# Patient Record
Sex: Female | Born: 1995 | Hispanic: Yes | Marital: Single | Smoking: Current some day smoker
Health system: Southern US, Community
[De-identification: ages and names within clinical notes are randomized; demographics above are authoritative.]

## PROBLEM LIST (undated history)

## (undated) DIAGNOSIS — N390 Urinary tract infection, site not specified: Secondary | ICD-10-CM

## (undated) DIAGNOSIS — J45909 Unspecified asthma, uncomplicated: Secondary | ICD-10-CM

## (undated) DIAGNOSIS — F419 Anxiety disorder, unspecified: Secondary | ICD-10-CM

## (undated) DIAGNOSIS — R32 Unspecified urinary incontinence: Secondary | ICD-10-CM

## (undated) DIAGNOSIS — K2960 Other gastritis without bleeding: Secondary | ICD-10-CM

## (undated) HISTORY — DX: Urinary tract infection, site not specified: N39.0

## (undated) HISTORY — DX: Unspecified urinary incontinence: R32

## (undated) HISTORY — DX: Anxiety disorder, unspecified: F41.9

## (undated) HISTORY — PX: COSMETIC SURGERY: SHX468

---
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
## 2016-09-02 ENCOUNTER — Ambulatory Visit (HOSPITAL_COMMUNITY)
Admission: EM | Admit: 2016-09-02 | Discharge: 2016-09-02 | Disposition: A | Discharge status: Home / Self Care | Payer: Medicaid Other | Attending: Emergency Medicine | Admitting: Emergency Medicine

## 2016-09-02 ENCOUNTER — Encounter (HOSPITAL_COMMUNITY): Payer: Self-pay | Admitting: Emergency Medicine

## 2016-09-02 DIAGNOSIS — J039 Acute tonsillitis, unspecified: Secondary | ICD-10-CM | POA: Insufficient documentation

## 2016-09-02 DIAGNOSIS — J029 Acute pharyngitis, unspecified: Secondary | ICD-10-CM | POA: Diagnosis present

## 2016-09-02 LAB — POCT RAPID STREP A: STREPTOCOCCUS, GROUP A SCREEN (DIRECT): NEGATIVE

## 2016-09-02 LAB — POCT INFECTIOUS MONO SCREEN: MONO SCREEN: NEGATIVE

## 2016-09-02 MED ORDER — PREDNISONE 20 MG PO TABS: 40.0000 mg | ORAL_TABLET | Freq: Every day | ORAL | 0 refills | Status: AC

## 2016-09-02 MED ORDER — AMOXICILLIN-POT CLAVULANATE 875-125 MG PO TABS: 1.0000 | ORAL_TABLET | Freq: Two times a day (BID) | ORAL | 0 refills | Status: DC

## 2016-09-02 NOTE — Discharge Instructions (Signed)
Recommend start Augmentin twice a day as directed. Take Prednisone daily for 5 days. Recommend follow-up with your primary care provider in 3 days if not improving- may need to be referred to an ENT.

## 2016-09-02 NOTE — ED Triage Notes (Signed)
Pt here for ST onset 1 week ++ associated w/dysphagia, swelling of tonsils and uvula, bilateral ear pain   Denies fevers, chills  Seen by PCP and strep culture came back negative.   A&O x4... NAD

## 2016-09-02 NOTE — ED Provider Notes (Signed)
CSN: [id number]     Arrival date & time 09/02/16  1233 History   First MD Initiated Contact with Patient 09/02/16 1352     Chief Complaint  Patient presents with  . Sore Throat   (Consider location/radiation/quality/duration/timing/severity/associated sxs/prior Treatment) 20 year old female presents with sore throat and tonsillar swelling for over 1 week. Has noticed occasional white patches on tonsils. Denies any nasal congestion, cough or GI symptoms. Had fever earlier in the week but is resolving. Was seen at her PCP- had negative strep culture and mono test. Has been taking Ibuprofen with minimal relief. Takes no other daily medication.    The history is provided by the patient.    History reviewed. No pertinent past medical history. History reviewed. No pertinent surgical history. History reviewed. No pertinent family history. Social History  Substance Use Topics  . Smoking status: Never Smoker  . Smokeless tobacco: Never Used  . Alcohol use No   OB History    No data available     Review of Systems  Constitutional: Positive for fatigue and fever. Negative for appetite change and chills.  HENT: Positive for sore throat and trouble swallowing. Negative for congestion, ear pain, rhinorrhea, sinus pressure and sneezing.   Eyes: Negative for discharge.  Respiratory: Negative for cough, shortness of breath and wheezing.   Cardiovascular: Negative for chest pain.  Gastrointestinal: Negative for abdominal pain, diarrhea, nausea and vomiting.  Musculoskeletal: Negative for neck pain and neck stiffness.  Skin: Negative for rash.  Neurological: Positive for headaches. Negative for dizziness, weakness and light-headedness.  Hematological: Positive for adenopathy.    Allergies  Review of patient's allergies indicates no known allergies.  Home Medications   Prior to Admission medications   Medication Sig Start Date End Date Taking? Authorizing Provider  amoxicillin-clavulanate  (AUGMENTIN) 875-125 MG tablet Take 1 tablet by mouth every 12 (twelve) hours. 09/02/16   Sudie GrumblingAnn Berry Lavada Langsam, NP  predniSONE (DELTASONE) 20 MG tablet Take 2 tablets (40 mg total) by mouth daily. 09/02/16 09/07/16  Sudie GrumblingAnn Berry Luretha Eberly, NP   Meds Ordered and Administered this Visit  Medications - No data to display  BP 98/58 (BP Location: Left Arm)   Pulse 82   Temp 98.4 F (36.9 C) (Oral)   Resp 16   LMP 08/11/2016   SpO2 100%  No data found.   Physical Exam  Constitutional: She is oriented to person, place, and time. She appears well-developed and well-nourished. She appears ill. No distress.  HENT:  Head: Normocephalic and atraumatic.  Right Ear: Hearing, tympanic membrane, external ear and ear canal normal.  Left Ear: Hearing, tympanic membrane, external ear and ear canal normal.  Nose: Nose normal. Right sinus exhibits no maxillary sinus tenderness and no frontal sinus tenderness. Left sinus exhibits no maxillary sinus tenderness and no frontal sinus tenderness.  Mouth/Throat: Uvula is midline and mucous membranes are normal. No uvula swelling. Oropharyngeal exudate (white on left tonsil), posterior oropharyngeal edema and posterior oropharyngeal erythema present. No tonsillar abscesses. Tonsils are 3+ on the right. Tonsils are 3+ on the left.  Neck: Normal range of motion. Neck supple.  Cardiovascular: Normal rate, regular rhythm and normal heart sounds.   Pulmonary/Chest: Effort normal and breath sounds normal. She has no wheezes. She has no rales. She exhibits no tenderness.  Lymphadenopathy:    She has cervical adenopathy.       Right cervical: Superficial cervical and deep cervical adenopathy present. No posterior cervical adenopathy present.  Left cervical: Superficial cervical and deep cervical adenopathy present. No posterior cervical adenopathy present.  Neurological: She is alert and oriented to person, place, and time.  Skin: Skin is warm and dry. Capillary refill takes less  than 2 seconds.  Psychiatric: She has a normal mood and affect. Her behavior is normal. Judgment and thought content normal.    Urgent Care Course   Clinical Course    Procedures (including critical care time)  Labs Review Labs Reviewed  CULTURE, GROUP A STREP [facility]  POCT RAPID STREP A  POCT INFECTIOUS MONO SCREEN    Imaging Review No results found.   Visual Acuity Review  Right Eye Distance:   Left Eye Distance:   Bilateral Distance:    Right Eye Near:   Left Eye Near:    Bilateral Near:         MDM   1. Tonsillitis    Start Augmentin 875mg  twice a day as directed. Take Prednisone 40mg  daily for 5 days. May take Tylenol as needed for pain. Follow-up with her primary care provider in 3 days if not improving. May need referral to ENT if symptoms persist.      Sudie GrumblingAnn Berry Terilyn Sano, NP 09/02/16 2337

## 2016-09-05 LAB — CULTURE, GROUP A STREP (THRC)

## 2017-01-02 ENCOUNTER — Emergency Department (HOSPITAL_COMMUNITY)
Admission: EM | Admit: 2017-01-02 | Discharge: 2017-01-02 | Disposition: A | Discharge status: Home / Self Care | Payer: Medicaid Other | Attending: Emergency Medicine | Admitting: Emergency Medicine

## 2017-01-02 ENCOUNTER — Encounter (HOSPITAL_COMMUNITY): Payer: Self-pay

## 2017-01-02 DIAGNOSIS — Z5189 Encounter for other specified aftercare: Secondary | ICD-10-CM

## 2017-01-02 DIAGNOSIS — Z4800 Encounter for change or removal of nonsurgical wound dressing: Secondary | ICD-10-CM | POA: Insufficient documentation

## 2017-01-02 MED ORDER — IBUPROFEN 400 MG PO TABS
ORAL_TABLET | ORAL | Status: AC
  Filled 2017-01-02: qty 2

## 2017-01-02 MED ORDER — IBUPROFEN 400 MG PO TABS
800.0000 mg | ORAL_TABLET | Freq: Once | ORAL | Status: AC
Start: 2017-01-02 — End: 2017-01-02
  Administered 2017-01-02: 800 mg via ORAL

## 2017-01-02 NOTE — ED Triage Notes (Signed)
Pt complaining of pain to L ear piercing. Pt states new piercings today, states newly onset black mark to piercing. No bleeding or discharge noted at triage.

## 2017-01-02 NOTE — ED Provider Notes (Signed)
[facility]-EMERGENCY DEPT Provider Note   CSN: [id number] Arrival date & time: 01/02/17  0132  By signing my name below, I, Marnette Burgess Long, attest that this documentation has been prepared under the direction and in the presence of Ross Marcus, MD. Electronically Signed: Christian Mate, Scribe. 01/02/2017. 3:56 AM.   History   Chief Complaint Chief Complaint  Patient presents with  . Otalgia   The history is provided by the patient. No language interpreter was used.    HPI Comments:  Stephanie Floyd is a 21 y.o. female with no pertinent PMHx, who presents to the Emergency Department complaining of left sided, 3/10, otalgia onset yesterday morning. She reports receiving two new cartilage ear pierc[place] with a piercing gun. Upon trying to clean the piercing site, she noticed a black ring appearing around the site on her left ear s/p the piercing. She additionally reports she is somewhat allergic to metallic alloys that are not gold and is worried she is developing an infection. No bleeding or discharge noted at this time. She does report mild ecchymosis to the piercing site. Pt denies any other complaints at this time. Patient also reports that she signed consent and is concerned about "damage to my cartilage" because of the black spot.   History reviewed. No pertinent past medical history.  There are no active problems to display for this patient.   History reviewed. No pertinent surgical history.  OB History    No data available       Home Medications    Prior to Admission medications   Medication Sig Start Date End Date Taking? Authorizing Provider  amoxicillin-clavulanate (AUGMENTIN) 875-125 MG tablet Take 1 tablet by mouth every 12 (twelve) hours. 09/02/16   Sudie Grumbling, NP    Family History History reviewed. No pertinent family history.  Social History Social History  Substance Use Topics  . Smoking status: Never Smoker  . Smokeless  tobacco: Never Used  . Alcohol use No     Allergies   Patient has no known allergies.   Review of Systems Review of Systems  HENT: Positive for ear pain. Negative for ear discharge.   Skin: Positive for color change. Negative for wound.  All other systems reviewed and are negative.    Physical Exam Updated Vital Signs BP 112/72 (BP Location: Left Arm)   Pulse 100   Temp 98 F (36.7 C) (Oral)   Resp 20   LMP 12/06/2016 (Approximate)   SpO2 100%   Physical Exam  Constitutional: She is oriented to person, place, and time. She appears well-developed and well-nourished.  Anxious appearing  HENT:  Head: Normocephalic and atraumatic.  2 cartilage piercings left ear, no significant swelling or erythema, no purulent drainage noted, slight discoloration noted at the piercing site consistent with mild bruising  Cardiovascular: Normal rate and regular rhythm.   Pulmonary/Chest: Effort normal. No respiratory distress.  Neurological: She is alert and oriented to person, place, and time.  Skin: Skin is warm and dry.  Psychiatric:  Anxious, tearful  Nursing note and vitals reviewed.    ED Treatments / Results  DIAGNOSTIC STUDIES:  Oxygen Saturation is 100% on RA, normal by my interpretation.    COORDINATION OF CARE:  3:55 AM Discussed treatment plan with pt at bedside including examination and education on proper cleaning of the wound and pt agreed to plan.  Labs (all labs ordered are listed, but only abnormal results are displayed) Labs Reviewed - No data  to display  EKG  EKG Interpretation None       Radiology No results found.  Procedures Procedures (including critical care time)  Medications Ordered in ED Medications  ibuprofen (ADVIL,MOTRIN) tablet 800 mg (not administered)     Initial Impression / Assessment and Plan / ED Course  I have reviewed the triage vital signs and the nursing notes.  Pertinent labs & imaging results that were available  during my care of the patient were reviewed by me and considered in my medical decision making (see chart for details).     Patient presents with concerns regarding recent piercing sites. She is concerned that she will have deformity along the cartilage because of this discoloration. She is nontoxic. Her piercing sites appear appropriately healing. No signs of infection. I believe the concern she has is regarding Argyria or discoloration related to oxidization of low quality jewelry.  I discussed at length with the patient that there is no evidence of infection. I cannot promise her that there will not be permanent discoloration related to jewelry. I encouraged her that if she is anxious regarding this, she can remove the earrings without any health risks. She is very tearful. I have encouraged her to follow-up with the facility where she got her ears pierced. I have also encouraged her to fully research any additional piercings or tattoos she would like to get.  After history, exam, and medical workup I feel the patient has been appropriately medically screened and is safe for discharge home. Pertinent diagnoses were discussed with the patient. Patient was given return precautions.   Final Clinical Impressions(s) / ED Diagnoses   Final diagnoses:  Visit for wound check    New Prescriptions New Prescriptions   No medications on file   I personally performed the services described in this documentation, which was scribed in my presence. The recorded information has been reviewed and is accurate.     Shon Batonourtney F Resha Filippone, MD 01/02/17 [id number]

## 2017-01-02 NOTE — Discharge Instructions (Signed)
Wallace CullensGray Skin Around Your Piercing  Is there a weird gray skin around your piercing? Here's what this means and what you can do:  Low-quality jewelry. The cause of the grey skin is most likely low-quality jewelry that you are wearing, which is common. Most affordable body jewelry that you find online or at places like [employer] are made of sterling silver, which is actually only .925% silver. It mostly contains other metals that can irritate the skin. These metals can also cause the area around the piercing to oxidize when the jewelry comes into contact with body fluids (sweat, natural oils on your face, etc.) This oxidization is what causes the gray stain. Argyria. Argyria is the proper term for this occurrence. It is a condition caused by improper exposure to silver or silver compounds. It causes the skin to become a gray, bluish-gray, orgrayish-black color.  Return if you have any concerns for infections.

## 2017-01-21 ENCOUNTER — Encounter (HOSPITAL_COMMUNITY): Payer: Self-pay

## 2017-01-21 DIAGNOSIS — K2900 Acute gastritis without bleeding: Secondary | ICD-10-CM | POA: Diagnosis not present

## 2017-01-21 DIAGNOSIS — R1011 Right upper quadrant pain: Secondary | ICD-10-CM | POA: Diagnosis present

## 2017-01-21 LAB — URINALYSIS, ROUTINE W REFLEX MICROSCOPIC
BACTERIA UA: NONE SEEN
Bilirubin Urine: NEGATIVE
Glucose, UA: NEGATIVE mg/dL
Ketones, ur: 80 mg/dL — AB
Leukocytes, UA: NEGATIVE
Nitrite: NEGATIVE
PROTEIN: 30 mg/dL — AB
Specific Gravity, Urine: 1.029 (ref 1.005–1.030)
pH: 5 (ref 5.0–8.0)

## 2017-01-21 LAB — CBC
HEMATOCRIT: 40.3 % (ref 36.0–46.0)
HEMOGLOBIN: 13.9 g/dL (ref 12.0–15.0)
MCH: 28.3 pg (ref 26.0–34.0)
MCHC: 34.5 g/dL (ref 30.0–36.0)
MCV: 82.1 fL (ref 78.0–100.0)
Platelets: 458 10*3/uL — ABNORMAL HIGH (ref 150–400)
RBC: 4.91 MIL/uL (ref 3.87–5.11)
RDW: 13.2 % (ref 11.5–15.5)
WBC: 8.3 10*3/uL (ref 4.0–10.5)

## 2017-01-21 LAB — COMPREHENSIVE METABOLIC PANEL
ALBUMIN: 4.7 g/dL (ref 3.5–5.0)
ALK PHOS: 59 U/L (ref 38–126)
ALT: 12 U/L — ABNORMAL LOW (ref 14–54)
ANION GAP: 12 (ref 5–15)
AST: 20 U/L (ref 15–41)
BUN: 8 mg/dL (ref 6–20)
CO2: 19 mmol/L — AB (ref 22–32)
Calcium: 9.7 mg/dL (ref 8.9–10.3)
Chloride: 104 mmol/L (ref 101–111)
Creatinine, Ser: 0.6 mg/dL (ref 0.44–1.00)
GFR calc Af Amer: >60 mL/min (ref >60)
GFR calc non Af Amer: >60 mL/min (ref >60)
GLUCOSE: 100 mg/dL — AB (ref 65–99)
POTASSIUM: 3.7 mmol/L (ref 3.5–5.1)
SODIUM: 135 mmol/L (ref 135–145)
Total Bilirubin: 0.7 mg/dL (ref 0.3–1.2)
Total Protein: 7.9 g/dL (ref 6.5–8.1)

## 2017-01-21 LAB — LIPASE, BLOOD: Lipase: 13 U/L (ref 11–51)

## 2017-01-21 NOTE — ED Notes (Signed)
Pt POC urine pregnancy NEGATIVE-meter docked x 2

## 2017-01-21 NOTE — ED Triage Notes (Signed)
Pt complaining of N/V x 3 days. Pt states hx of gastritis. Pt also complaining of mid lower abdominal pain.

## 2017-01-22 ENCOUNTER — Emergency Department (HOSPITAL_COMMUNITY): Payer: Medicaid Other

## 2017-01-22 ENCOUNTER — Emergency Department (HOSPITAL_COMMUNITY)
Admission: EM | Admit: 2017-01-22 | Discharge: 2017-01-22 | Disposition: A | Discharge status: Home / Self Care | Payer: Medicaid Other | Attending: Emergency Medicine | Admitting: Emergency Medicine

## 2017-01-22 DIAGNOSIS — R198 Other specified symptoms and signs involving the digestive system and abdomen: Secondary | ICD-10-CM

## 2017-01-22 DIAGNOSIS — K2900 Acute gastritis without bleeding: Secondary | ICD-10-CM

## 2017-01-22 LAB — POC URINE PREG, ED: PREG TEST UR: NEGATIVE

## 2017-01-22 MED ORDER — ONDANSETRON 4 MG PO TBDP
ORAL_TABLET | ORAL | 0 refills | Status: DC
Start: 2017-01-22 — End: 2017-02-14

## 2017-01-22 MED ORDER — SODIUM CHLORIDE 0.9 % IV BOLUS (SEPSIS)
2000.0000 mL | Freq: Once | INTRAVENOUS | Status: AC
  Administered 2017-01-22: 2000 mL via INTRAVENOUS

## 2017-01-22 MED ORDER — PANTOPRAZOLE SODIUM 40 MG IV SOLR
40.0000 mg | Freq: Once | INTRAVENOUS | Status: AC
  Administered 2017-01-22: 40 mg via INTRAVENOUS
  Filled 2017-01-22: qty 40

## 2017-01-22 MED ORDER — PANTOPRAZOLE SODIUM 20 MG PO TBEC: 20.0000 mg | DELAYED_RELEASE_TABLET | Freq: Every day | ORAL | 0 refills | Status: DC

## 2017-01-22 MED ORDER — ONDANSETRON HCL 4 MG/2ML IJ SOLN
4.0000 mg | Freq: Once | INTRAMUSCULAR | Status: AC
  Administered 2017-01-22: 4 mg via INTRAVENOUS
  Filled 2017-01-22: qty 2

## 2017-01-22 NOTE — ED Notes (Signed)
ED Provider at bedside. 

## 2017-01-22 NOTE — Discharge Instructions (Signed)
1. Medications: zofran, vicodin, usual home medications 2. Treatment: rest, drink plenty of fluids, advance diet slowly 3. Follow Up: Please followup with your primary doctor in 2 days for discussion of your diagnoses and further evaluation after today's visit; if you do not have a primary care doctor use the resource guide provided to find one; Please return to the ER for persistent vomiting, high fevers or worsening symptoms  

## 2017-01-22 NOTE — ED Provider Notes (Signed)
[facility] Provider Note   CSN: [id number] Arrival date & time: 01/21/17  2304     History   Chief Complaint Chief Complaint  Patient presents with  . Abdominal Pain  . Emesis    HPI Stephanie Floyd is a 21 y.o. female with a hx of gastritis presents to the Emergency Department complaining of gradual, persistent, progressively worsening epigastric, periumbilical and RUQ abd pain onset 48 hrs ago.  Pt reports symptoms are similar to previous episodes of gastritis.  2 episodes of NBNB emesis.  No diarrhea.  Associated symptoms include decreased PO intake due to the pain and decreased urine output.  Nothing makes it better and eating and drinking makes it worse.  She denies regular alcohol usage. She does report that she took several shots 24 hours ago which did not worsen or improve her pain. She denies IV drug use and smoking. Pt denies fever, chills, recent travel, abx usage.  No diarrhea.  Pt reports increased stress at home.       The history is provided by the patient and medical records. No language interpreter was used.    History reviewed. No pertinent past medical history.  There are no active problems to display for this patient.   History reviewed. No pertinent surgical history.  OB History    No data available       Home Medications    Prior to Admission medications   Medication Sig Start Date End Date Taking? Authorizing Provider  polyethylene glycol (MIRALAX / GLYCOLAX) packet Take 17 g by mouth daily as needed for mild constipation.   Yes Historical Provider, MD  ondansetron (ZOFRAN ODT) 4 MG disintegrating tablet 4mg  ODT q4 hours prn nausea/vomit 01/22/17   Yana Schorr, PA-C  pantoprazole (PROTONIX) 20 MG tablet Take 1 tablet (20 mg total) by mouth daily. 01/22/17   Dahlia ClientHannah Katheen Aslin, PA-C    Family History History reviewed. No pertinent family history.  Social History Social History  Substance Use Topics  . Smoking status: Never  Smoker  . Smokeless tobacco: Never Used  . Alcohol use No     Allergies   Patient has no known allergies.   Review of Systems Review of Systems  Gastrointestinal: Positive for abdominal pain, nausea and vomiting.  All other systems reviewed and are negative.    Physical Exam Updated Vital Signs BP (!) 141/90 (BP Location: Left Arm)   Pulse (!) 117   Temp 99 F (37.2 C) (Oral)   Resp 18   LMP 12/31/2016 (Approximate)   SpO2 99%   Physical Exam  Constitutional: She appears well-developed and well-nourished. No distress.  Awake, alert, nontoxic appearance  HENT:  Head: Normocephalic and atraumatic.  Mouth/Throat: Oropharynx is clear and moist. No oropharyngeal exudate.  Eyes: Conjunctivae are normal. No scleral icterus.  Neck: Normal range of motion. Neck supple.  Cardiovascular: Regular rhythm and intact distal pulses.  Tachycardia present.   Pulses:      Radial pulses are 2+ on the right side, and 2+ on the left side.       Dorsalis pedis pulses are 2+ on the right side, and 2+ on the left side.  Pulmonary/Chest: Effort normal and breath sounds normal. No respiratory distress. She has no wheezes.  Equal chest expansion  Abdominal: Soft. Bowel sounds are normal. She exhibits no mass. There is tenderness in the right upper quadrant, epigastric area and periumbilical area. There is no rigidity, no rebound, no guarding, no tenderness at McBurney's point and negative  Murphy's sign.  Musculoskeletal: Normal range of motion. She exhibits no edema.  Neurological: She is alert.  Speech is clear and goal oriented Moves extremities without ataxia  Skin: Skin is warm and dry. She is not diaphoretic.  Psychiatric: She has a normal mood and affect.  Nursing note and vitals reviewed.    ED Treatments / Results  Labs (all labs ordered are listed, but only abnormal results are displayed) Labs Reviewed  COMPREHENSIVE METABOLIC PANEL - Abnormal; Notable for the following:        Result Value   CO2 19 (*)    Glucose, Bld 100 (*)    ALT 12 (*)    All other components within normal limits  CBC - Abnormal; Notable for the following:    Platelets 458 (*)    All other components within normal limits  URINALYSIS, ROUTINE W REFLEX MICROSCOPIC - Abnormal; Notable for the following:    APPearance HAZY (*)    Hgb urine dipstick MODERATE (*)    Ketones, ur 80 (*)    Protein, ur 30 (*)    Squamous Epithelial / LPF 0-5 (*)    All other components within normal limits  LIPASE, BLOOD  POC URINE PREG, ED     Radiology US Abdomen Complete  Result Date: 01/22/2017 CLINICAL DATA:  Epigastric, periumbilical, right upper quadrant pain. Nausea and vomiting. Anorexia. EXAM: ABDOMEN ULTRASOUND COMPLETE COMPARISON:  None. FINDINGS: Gallbladder: Physiologically distended. No gallstones or wall thickening visualized. No sonographic Murphy sign noted by sonographer. Common bile duct: Diameter: 2 mm. Liver: No focal lesion identified. Within normal limits in parenchymal echogenicity. Normal directional flow in the main portal vein. IVC: No abnormality visualized. Pancreas: Visualized portion unremarkable. Spleen: Size and appearance within normal limits. Right Kidney: Length: 9.7 cm. Echogenicity within normal limits. No mass or hydronephrosis visualized. Left Kidney: Length: 10.5 cm. Echogenicity within normal limits. No mass or hydronephrosis visualized. Abdominal aorta: No aneurysm visualized. Other findings: No ascites. IMPRESSION: Normal abdominal ultrasound. Electronically Signed   By: Rubye Oaks M.D.   On: 01/22/2017 02:57    Procedures Procedures (including critical care time)  Medications Ordered in ED Medications  sodium chloride 0.9 % bolus 2,000 mL (2,000 mLs Intravenous New Bag/Given 01/22/17 0202)  ondansetron (ZOFRAN) injection 4 mg (4 mg Intravenous Given 01/22/17 0202)  pantoprazole (PROTONIX) injection 40 mg (40 mg Intravenous Given 01/22/17 1610)     Initial  Impression / Assessment and Plan / ED Course  I have reviewed the triage vital signs and the nursing notes.  Pertinent labs & imaging results that were available during my care of the patient were reviewed by me and considered in my medical decision making (see chart for details).     Pt presented with epigastric abdominal pain for the last several days.  She is well-appearing however somewhat dehydrated. Urinalysis with ketones and patient with tachycardia. Fluids given with resolution of tachycardia. She reports she is feeling much better. She has tolerated by mouth without vomiting here in the emergency department. Abdomen remains soft on exam. Ultrasound without evidence of cholecystitis. Highly doubt diverticulitis, appendicitis, pyelonephritis, pancreatitis. Patient will need primary care and gastroenterology follow-up. Discussed importance of bland diet and stress reduction. She will be started on Protonix. Patient states understanding and is in agreement with the plan.  Final Clinical Impressions(s) / ED Diagnoses   Final diagnoses:  Acute gastritis without hemorrhage, unspecified gastritis type  Peptic ulcer symptoms    New Prescriptions New Prescriptions   ONDANSETRON (  ZOFRAN ODT) 4 MG DISINTEGRATING TABLET    4mg  ODT q4 hours prn nausea/vomit   PANTOPRAZOLE (PROTONIX) 20 MG TABLET    Take 1 tablet (20 mg total) by mouth daily.     Dahlia Client Rosamund Nyland, PA-C 01/22/17 1610    Zadie Rhine, MD 01/23/17 [phone]

## 2017-01-22 NOTE — ED Notes (Signed)
Patient transported to Ultrasound 

## 2017-02-14 ENCOUNTER — Encounter (HOSPITAL_COMMUNITY): Payer: Self-pay | Admitting: Emergency Medicine

## 2017-02-14 ENCOUNTER — Ambulatory Visit (HOSPITAL_COMMUNITY)
Admission: EM | Admit: 2017-02-14 | Discharge: 2017-02-14 | Disposition: A | Discharge status: Home / Self Care | Payer: Medicaid Other | Attending: Family Medicine | Admitting: Family Medicine

## 2017-02-14 DIAGNOSIS — Z3202 Encounter for pregnancy test, result negative: Secondary | ICD-10-CM

## 2017-02-14 DIAGNOSIS — N938 Other specified abnormal uterine and vaginal bleeding: Secondary | ICD-10-CM | POA: Diagnosis not present

## 2017-02-14 DIAGNOSIS — N939 Abnormal uterine and vaginal bleeding, unspecified: Secondary | ICD-10-CM | POA: Diagnosis not present

## 2017-02-14 HISTORY — DX: Other gastritis without bleeding: K29.60

## 2017-02-14 LAB — POCT URINALYSIS DIP (DEVICE)
Bilirubin Urine: NEGATIVE
GLUCOSE, UA: NEGATIVE mg/dL
Ketones, ur: NEGATIVE mg/dL
Leukocytes, UA: NEGATIVE
Nitrite: NEGATIVE
PROTEIN: NEGATIVE mg/dL
Specific Gravity, Urine: 1.02 (ref 1.005–1.030)
UROBILINOGEN UA: 0.2 mg/dL (ref 0.0–1.0)
pH: 6 (ref 5.0–8.0)

## 2017-02-14 LAB — POCT PREGNANCY, URINE: PREG TEST UR: NEGATIVE

## 2017-02-14 MED ORDER — NORETHINDRONE-ETH ESTRADIOL 1-35 MG-MCG PO TABS: 1.0000 | ORAL_TABLET | Freq: Every day | ORAL | 11 refills | Status: DC

## 2017-02-14 NOTE — ED Triage Notes (Signed)
The patient presented to the [facility] with a complaint of abnormal menses and lower abdominal pain x 2 days.

## 2017-02-14 NOTE — ED Provider Notes (Signed)
CSN: [id number]     Arrival date & time 02/14/17  1404 History   None    Chief Complaint  Patient presents with  . Vaginal Bleeding   (Consider location/radiation/quality/duration/timing/severity/associated sxs/prior Treatment) Patient c/o abnormal menses.  She has used plan B twice in the last month.  She denies any pelvic pain.  She request Birth Control.   The history is provided by the patient.  Vaginal Bleeding  Quality:  Typical of menses Severity:  Moderate Onset quality:  Sudden Duration:  2 days Timing:  Intermittent Progression:  Waxing and waning Chronicity:  New Menstrual history:  Irregular Possible pregnancy: no   Relieved by:  Nothing Worsened by:  Nothing   Past Medical History:  Diagnosis Date  . Reflux gastritis    History reviewed. No pertinent surgical history. History reviewed. No pertinent family history. Social History  Substance Use Topics  . Smoking status: Never Smoker  . Smokeless tobacco: Never Used  . Alcohol use No   OB History    No data available     Review of Systems  Constitutional: Negative.   HENT: Negative.   Eyes: Negative.   Respiratory: Negative.   Cardiovascular: Negative.   Gastrointestinal: Negative.   Endocrine: Negative.   Genitourinary: Positive for vaginal bleeding.  Musculoskeletal: Negative.   Skin: Negative.   Allergic/Immunologic: Negative.   Neurological: Negative.   Hematological: Negative.   Psychiatric/Behavioral: Negative.     Allergies  Patient has no known allergies.  Home Medications   Prior to Admission medications   Medication Sig Start Date End Date Taking? Authorizing Provider  pantoprazole (PROTONIX) 20 MG tablet Take 1 tablet (20 mg total) by mouth daily. 01/22/17  Yes Hannah Muthersbaugh, PA-C  norethindrone-ethinyl estradiol 1/35 (ORTHO-NOVUM 1/35, 28,) tablet Take 1 tablet by mouth daily. 02/14/17   Deatra Canter, FNP   Meds Ordered and Administered this Visit  Medications - No  data to display  BP 134/79 (BP Location: Right Arm)   Pulse (!) 103   Temp 98.2 F (36.8 C) (Oral)   Resp 16   LMP 01/28/2017 (Exact Date)   SpO2 100%  No data found.   Physical Exam  Constitutional: She appears well-developed and well-nourished.  HENT:  Head: Normocephalic and atraumatic.  Eyes: Conjunctivae and EOM are normal. Pupils are equal, round, and reactive to light.  Neck: Normal range of motion. Neck supple.  Cardiovascular: Normal rate, regular rhythm and normal heart sounds.   Pulmonary/Chest: Effort normal and breath sounds normal.  Abdominal: Soft. Bowel sounds are normal.  Nursing note and vitals reviewed.   Urgent Care Course     Procedures (including critical care time)  Labs Review Labs Reviewed  POCT URINALYSIS DIP (DEVICE) - Abnormal; Notable for the following:       Result Value   Hgb urine dipstick MODERATE (*)    All other components within normal limits  POCT PREGNANCY, URINE  URINE CYTOLOGY ANCILLARY ONLY    Imaging Review No results found.   Visual Acuity Review  Right Eye Distance:   Left Eye Distance:   Bilateral Distance:    Right Eye Near:   Left Eye Near:    Bilateral Near:         MDM   1. Vaginal bleeding   2.  DUB Ortho-Novum one po qd #1 Package with 11 rf      Anselm Pancoast [place], [place] 02/14/17 1510

## 2017-02-15 LAB — URINE CYTOLOGY ANCILLARY ONLY
Chlamydia: NEGATIVE
Neisseria Gonorrhea: NEGATIVE
Trichomonas: NEGATIVE

## 2017-03-14 ENCOUNTER — Encounter (HOSPITAL_COMMUNITY): Payer: Self-pay | Admitting: Emergency Medicine

## 2017-03-14 DIAGNOSIS — Z79899 Other long term (current) drug therapy: Secondary | ICD-10-CM | POA: Insufficient documentation

## 2017-03-14 DIAGNOSIS — R109 Unspecified abdominal pain: Secondary | ICD-10-CM | POA: Insufficient documentation

## 2017-03-14 DIAGNOSIS — Z5321 Procedure and treatment not carried out due to patient leaving prior to being seen by health care provider: Secondary | ICD-10-CM | POA: Insufficient documentation

## 2017-03-14 NOTE — ED Triage Notes (Signed)
Pt reports that she has a "burning feeling" that "goes from my throat to my abdomen.  States the pain in now constant, not "just when I eat spicy foods"

## 2017-03-15 ENCOUNTER — Emergency Department (HOSPITAL_COMMUNITY)
Admission: EM | Admit: 2017-03-15 | Discharge: 2017-03-15 | Disposition: A | Discharge status: Home / Self Care | Payer: Medicaid Other | Attending: Emergency Medicine | Admitting: Emergency Medicine

## 2017-03-15 LAB — COMPREHENSIVE METABOLIC PANEL
ALT: 13 U/L — ABNORMAL LOW (ref 14–54)
AST: 20 U/L (ref 15–41)
Albumin: 3.8 g/dL (ref 3.5–5.0)
Alkaline Phosphatase: 54 U/L (ref 38–126)
Anion gap: 7 (ref 5–15)
BILIRUBIN TOTAL: 0.3 mg/dL (ref 0.3–1.2)
BUN: 11 mg/dL (ref 6–20)
CALCIUM: 9.1 mg/dL (ref 8.9–10.3)
CO2: 25 mmol/L (ref 22–32)
Chloride: 104 mmol/L (ref 101–111)
Creatinine, Ser: 0.7 mg/dL (ref 0.44–1.00)
GFR calc Af Amer: >60 mL/min (ref >60)
Glucose, Bld: 108 mg/dL — ABNORMAL HIGH (ref 65–99)
POTASSIUM: 3.6 mmol/L (ref 3.5–5.1)
Sodium: 136 mmol/L (ref 135–145)
TOTAL PROTEIN: 7.2 g/dL (ref 6.5–8.1)

## 2017-03-15 LAB — URINALYSIS, ROUTINE W REFLEX MICROSCOPIC
Bilirubin Urine: NEGATIVE
Glucose, UA: NEGATIVE mg/dL
Hgb urine dipstick: NEGATIVE
KETONES UR: NEGATIVE mg/dL
LEUKOCYTES UA: NEGATIVE
NITRITE: NEGATIVE
PROTEIN: NEGATIVE mg/dL
Specific Gravity, Urine: 1.02 (ref 1.005–1.030)
pH: 5 (ref 5.0–8.0)

## 2017-03-15 LAB — LIPASE, BLOOD: Lipase: 23 U/L (ref 11–51)

## 2017-03-15 LAB — CBC
HEMATOCRIT: 40.9 % (ref 36.0–46.0)
Hemoglobin: 13.7 g/dL (ref 12.0–15.0)
MCH: 28.3 pg (ref 26.0–34.0)
MCHC: 33.5 g/dL (ref 30.0–36.0)
MCV: 84.5 fL (ref 78.0–100.0)
PLATELETS: 402 10*3/uL — AB (ref 150–400)
RBC: 4.84 MIL/uL (ref 3.87–5.11)
RDW: 13.4 % (ref 11.5–15.5)
WBC: 7.4 10*3/uL (ref 4.0–10.5)

## 2017-03-15 LAB — I-STAT BETA HCG BLOOD, ED (MC, WL, AP ONLY)

## 2017-05-17 ENCOUNTER — Ambulatory Visit (HOSPITAL_COMMUNITY)
Admission: EM | Admit: 2017-05-17 | Discharge: 2017-05-17 | Disposition: A | Discharge status: Home / Self Care | Payer: Medicaid Other | Attending: Family Medicine | Admitting: Family Medicine

## 2017-05-17 ENCOUNTER — Encounter (HOSPITAL_COMMUNITY): Payer: Self-pay

## 2017-05-17 DIAGNOSIS — K2900 Acute gastritis without bleeding: Secondary | ICD-10-CM | POA: Diagnosis not present

## 2017-05-17 MED ORDER — SUCRALFATE 1 GM/10ML PO SUSP: 1.0000 g | Freq: Three times a day (TID) | ORAL | 0 refills | Status: DC

## 2017-05-17 MED ORDER — PANTOPRAZOLE SODIUM 20 MG PO TBEC: 20.0000 mg | DELAYED_RELEASE_TABLET | Freq: Every day | ORAL | 0 refills | Status: DC

## 2017-05-17 NOTE — ED Triage Notes (Signed)
Pt said she thinks she has gastritis. Has been hurting since 3am this morning. Loss of appetite, nausea, epigastric pain, last bowel movement yesterday. Did take zantac didn't help. Does have a low grade fever. Hurts when she lays down. Pt is complaining of lightheaded and dizziness.

## 2017-05-21 NOTE — ED Provider Notes (Signed)
  [facility]   [id number] 05/17/17 Arrival Time: 1908  ASSESSMENT & PLAN:  1. Acute gastritis without hemorrhage, unspecified gastritis type    Meds ordered this encounter  Medications  . pantoprazole (PROTONIX) 20 MG tablet    Sig: Take 1 tablet (20 mg total) by mouth daily.    Dispense:  30 tablet    Refill:  0  . sucralfate (CARAFATE) 1 GM/10ML suspension    Sig: Take 10 mLs (1 g total) by mouth 4 (four) times daily -  with meals and at bedtime.    Dispense:  420 mL    Refill:  0   Recommend f/u if not showing improvement within 48 hours, sooner if needed.  Reviewed expectations re: course of current medical issues. Questions answered. Outlined signs and symptoms indicating need for more acute intervention. Patient verbalized understanding. After Visit Summary given.   SUBJECTIVE:  Stephanie Floyd is a 21 y.o. female who presents with complaint of GERD exacerbation. 3 days. Typical of previous symptoms. Epigastric discomfort. Belching more. Worse after meals. Zantac prn without much help. Overall decreased PO intake secondary to GERD. Mild occasional nausea without vomiting. No back pain. Afebrile.  ROS: As per HPI.   OBJECTIVE:  Vitals:   05/17/17 1937 05/17/17 1941  BP:  (!) 96/50  Pulse: 98   Resp: 18   Temp: 99.3 F (37.4 C)   TempSrc: Oral   SpO2: 100%      General appearance: alert; no distress Abdomen: soft, non-tender except for mild epigastric discomfort with palpation; bowel sounds normal; no masses or organomegaly; no guarding or rebound tenderness Back: no CVA tenderness Extremities: no cyanosis or edema; symmetrical with no gross deformities Skin: warm and dry  No Known Allergies  PMHx, SurgHx, SocialHx, Medications, and Allergies were reviewed in the Visit Navigator and updated as appropriate.      Mardella LaymanHagler, Oliva Montecalvo, MD 05/21/17 Rickey [id number]

## 2017-07-23 ENCOUNTER — Encounter (HOSPITAL_COMMUNITY): Payer: Self-pay | Admitting: Emergency Medicine

## 2017-07-23 ENCOUNTER — Emergency Department (HOSPITAL_COMMUNITY)
Admission: EM | Admit: 2017-07-23 | Discharge: 2017-07-23 | Disposition: A | Discharge status: Home / Self Care | Payer: Self-pay | Attending: Emergency Medicine | Admitting: Emergency Medicine

## 2017-07-23 DIAGNOSIS — N3000 Acute cystitis without hematuria: Secondary | ICD-10-CM | POA: Insufficient documentation

## 2017-07-23 DIAGNOSIS — Z79899 Other long term (current) drug therapy: Secondary | ICD-10-CM | POA: Insufficient documentation

## 2017-07-23 DIAGNOSIS — B379 Candidiasis, unspecified: Secondary | ICD-10-CM | POA: Insufficient documentation

## 2017-07-23 LAB — URINALYSIS, ROUTINE W REFLEX MICROSCOPIC
BILIRUBIN URINE: NEGATIVE
Glucose, UA: NEGATIVE mg/dL
HGB URINE DIPSTICK: NEGATIVE
Ketones, ur: NEGATIVE mg/dL
NITRITE: NEGATIVE
PH: 5 (ref 5.0–8.0)
Protein, ur: NEGATIVE mg/dL
SPECIFIC GRAVITY, URINE: 1.024 (ref 1.005–1.030)

## 2017-07-23 LAB — I-STAT BETA HCG BLOOD, ED (MC, WL, AP ONLY): I-stat hCG, quantitative: <5 m[IU]/mL (ref <5)

## 2017-07-23 LAB — PREGNANCY, URINE: PREG TEST UR: NEGATIVE

## 2017-07-23 LAB — WET PREP, GENITAL
Clue Cells Wet Prep HPF POC: NONE SEEN
SPERM: NONE SEEN
TRICH WET PREP: NONE SEEN
YEAST WET PREP: NONE SEEN

## 2017-07-23 MED ORDER — FLUCONAZOLE 200 MG PO TABS: 200.0000 mg | ORAL_TABLET | Freq: Every day | ORAL | 0 refills | Status: AC

## 2017-07-23 MED ORDER — CEPHALEXIN 500 MG PO CAPS: 500.0000 mg | ORAL_CAPSULE | Freq: Four times a day (QID) | ORAL | 0 refills | Status: DC

## 2017-07-23 NOTE — Discharge Instructions (Signed)
Take antibiotics as directed. Please take all of your antibiotics until finished.  The test results with take 2-3 days to return. If there is an abnormal result, you will be notified. If you do not hear anything, that means the results were negative. You can also log on MyChart to see the results. If it is positive, you'll be directed on how to receive antibiotic. It is positive, your partner needs to be treated also. Do not have sexual intercourse until he completes antibiotics or her partner has been treated.  Follow-up with your primary care doctor in 2-4 days. If you do not have a primary care doctor, you can use one listed in the paperwork.   Return to the Emergency Department for any fever, abdominal pain, difficulty breathing, nausea/vomiting or any other worsening or concerning symptoms.

## 2017-07-23 NOTE — ED Triage Notes (Signed)
Pt to ER for evaluation of vaginal burning with white, thick discharge onset yesterday. States took over the counter medication, monistat, and is not feeling any relief. States burning on urination as well.

## 2017-07-23 NOTE — ED Provider Notes (Signed)
[facility] DEPT Provider Note   CSN: [id number] Arrival date & time: 07/23/17  0756     History   Chief Complaint Chief Complaint  Patient presents with  . Vaginitis    HPI Stephanie Floyd is a 21 y.o. female resents with 1 day of dysuria, vaginal itching and white vaginal discharge. Patient reports that symptoms initially started with a burning sensation. Patient then noticed she was having some white discharge. Patient states that she has had use infections before and thought symptoms were due to a yeast infection. Patient reports that she took one over-the-counter pill that she had used previously for yeast infections but states that she did not have any improvement in symptoms. Patient reports that she currently is sexually active with one sexual partner. She states that they do use condoms for protection. Patient denies any history of STDs. Patient denies any fever, chills, chest pain, SOB, abdominal pain, nausea/vomiting, vaginal bleeding, hematuria.  The history is provided by the patient.    Past Medical History:  Diagnosis Date  . Reflux gastritis     There are no active problems to display for this patient.   History reviewed. No pertinent surgical history.  OB History    No data available       Home Medications    Prior to Admission medications   Medication Sig Start Date End Date Taking? Authorizing Provider  miconazole (MONISTAT 1 COMBINATION PACK) kit Place 1 each vaginally once.   Yes [provider]  Multiple Vitamin (MULTIVITAMIN) tablet Take 1 tablet by mouth daily.   Yes [provider]  norethindrone-ethinyl estradiol 1/35 ([place] 1/35, 28,) tablet Take 1 tablet by mouth daily. 02/14/17  Yes Lysbeth Penner, FNP  pantoprazole (PROTONIX) 20 MG tablet Take 1 tablet (20 mg total) by mouth daily. 05/17/17  Yes Hagler, Aaron Edelman, MD  cephALEXin (KEFLEX) 500 MG capsule Take 1 capsule (500 mg total) by mouth 4 (four) times daily.  07/23/17   Volanda Napoleon, PA-C  fluconazole (DIFLUCAN) 200 MG tablet Take 1 tablet (200 mg total) by mouth daily. 07/23/17 07/24/17  Volanda Napoleon, PA-C  sucralfate (CARAFATE) 1 GM/10ML suspension Take 10 mLs (1 g total) by mouth 4 (four) times daily -  with meals and at bedtime. Patient not taking: Reported on 07/23/2017 05/17/17   Vanessa Kick, MD    Family History History reviewed. No pertinent family history.  Social History Social History  Substance Use Topics  . Smoking status: Never Smoker  . Smokeless tobacco: Never Used  . Alcohol use No     Allergies   Patient has no known allergies.   Review of Systems Review of Systems  Constitutional: Negative for chills and fever.  Respiratory: Negative for shortness of breath.   Cardiovascular: Negative for chest pain.  Gastrointestinal: Negative for abdominal pain, diarrhea, nausea and vomiting.  Genitourinary: Positive for dysuria and vaginal discharge. Negative for hematuria and vaginal bleeding.     Physical Exam Updated Vital Signs BP 115/68 (BP Location: Right Arm)   Pulse 80   Temp 98.2 F (36.8 C) (Oral)   Resp 16   LMP 07/03/2017   SpO2 99%   Physical Exam  Constitutional: She is oriented to person, place, and time. She appears well-developed and well-nourished.  Sitting comfortably on examination table  HENT:  Head: Normocephalic and atraumatic.  Mouth/Throat: Oropharynx is clear and moist and mucous membranes are normal.  Eyes: Pupils are equal, round, and reactive to light. Conjunctivae, EOM and lids  are normal.  Neck: Full passive range of motion without pain.  Cardiovascular: Normal rate, regular rhythm, normal heart sounds and normal pulses.  Exam reveals no gallop and no friction rub.   No murmur heard. Pulmonary/Chest: Effort normal and breath sounds normal.  Abdominal: Soft. Normal appearance. There is no tenderness. There is no rigidity, no guarding and no CVA tenderness.  No CVA tenderness  bilaterally. Abdomen is soft, non-distended, non-tender.   Genitourinary: Vagina normal and uterus normal. Cervix exhibits discharge. Cervix exhibits no motion tenderness. Right adnexum displays no mass and no tenderness. Left adnexum displays no mass and no tenderness.  Genitourinary Comments: The exam was performed with a chaperone present. Normal external female genitalia. No lesions, rash, or sores. Patient has thick, white discharge. Cervix is slightly erythematous. No CMT.  No adnexal mass, tenderness. No uterine tenderness.   Musculoskeletal: Normal range of motion.  Neurological: She is alert and oriented to person, place, and time.  Skin: Skin is warm and dry. Capillary refill takes less than 2 seconds.  Psychiatric: She has a normal mood and affect. Her speech is normal.  Nursing note and vitals reviewed.    ED Treatments / Results  Labs (all labs ordered are listed, but only abnormal results are displayed) Labs Reviewed  WET PREP, GENITAL - Abnormal; Notable for the following:       Result Value   WBC, Wet Prep HPF POC MANY (*)    All other components within normal limits  URINALYSIS, ROUTINE W REFLEX MICROSCOPIC - Abnormal; Notable for the following:    APPearance HAZY (*)    Leukocytes, UA SMALL (*)    Bacteria, UA FEW (*)    Squamous Epithelial / LPF 0-5 (*)    All other components within normal limits  PREGNANCY, URINE  POC URINE PREG, ED  I-STAT BETA HCG BLOOD, ED ([facility], [facility], [facility] ONLY)  GC/CHLAMYDIA PROBE AMP ([place]) NOT AT [facility]    EKG  EKG Interpretation None       Radiology No results found.  Procedures Procedures (including critical care time)  Medications Ordered in ED Medications - No data to display   Initial Impression / Assessment and Plan / ED Course  I have reviewed the triage vital signs and the nursing notes.  Pertinent labs & imaging results that were available during my care of the patient were reviewed by me and considered in my  medical decision making (see chart for details).     21 year old female who presents with 1 day of vaginal discharge, dysuria and vaginal itching. No fever, chills, abdominal pain. No vaginal bleeding. Patient is afebrile, non-toxic appearing, sitting comfortably on examination table. Vital signs reviewed and stable. Consider vaginitis versus STD versus UTI. History/physical exam are not concerning for pyelonephritis or kidney stone. Plan to check UA and urine pregnancy. Will perform a pelvic exam for further evaluation.  Pelvic exam performed as documented above. There was white, thick discharge concerning for yeast. The cervix was erythematous. No CMT or friability. No adnexal mass or tenderness. Exam is not concerning for PID.  Labs reviewed. Urine shows small leukocytes. Given that patient is symptomatic with dysuria, will treat. Urine pregnancy is negative. Wet prep is negative for any clue cells, trichomonas, yeast. Discussed results with patient. Explained to patient that the gonorrhea/chlamydia results have not come back for several days. Explained options of potentially treating in the emergency department or waiting 2 days until the results come back. Explained our source benefits of waiting  for treatment of this time. Patient chooses to wait for the results come back to obtain treatment for GC/chlamydia. I feel this is reasonable. Given the appearance, we'll plan to treat for a yeast infection. Provided patient with a list of clinic resources to use if he does not have a PCP. Instructed to call them today to arrange follow-up in the next 24-48 hours. Strict return precautions discussed. Patient expresses understanding and agreement to plan.   Final Clinical Impressions(s) / ED Diagnoses   Final diagnoses:  Acute cystitis without hematuria  Yeast infection    New Prescriptions Discharge Medication List as of 07/23/2017  1:03 PM    START taking these medications   Details  cephALEXin  (KEFLEX) 500 MG capsule Take 1 capsule (500 mg total) by mouth 4 (four) times daily., Starting Mon 07/23/2017, Print    fluconazole (DIFLUCAN) 200 MG tablet Take 1 tablet (200 mg total) by mouth daily., Starting Mon 07/23/2017, Until Tue 07/24/2017, Print         Volanda Napoleon, PA-C 07/23/17 [place], MD 07/24/17 1520

## 2017-07-23 NOTE — ED Notes (Signed)
Pelvic cart set up at bedside  

## 2017-07-24 LAB — GC/CHLAMYDIA PROBE AMP (~~LOC~~) NOT AT ARMC
CHLAMYDIA, DNA PROBE: NEGATIVE
Neisseria Gonorrhea: NEGATIVE

## 2018-01-29 ENCOUNTER — Encounter (HOSPITAL_COMMUNITY): Payer: Self-pay

## 2018-01-29 ENCOUNTER — Emergency Department (HOSPITAL_COMMUNITY)
Admission: EM | Admit: 2018-01-29 | Discharge: 2018-01-29 | Disposition: A | Discharge status: Home / Self Care | Payer: Self-pay | Attending: Emergency Medicine | Admitting: Emergency Medicine

## 2018-01-29 ENCOUNTER — Other Ambulatory Visit: Payer: Self-pay

## 2018-01-29 DIAGNOSIS — R197 Diarrhea, unspecified: Secondary | ICD-10-CM | POA: Insufficient documentation

## 2018-01-29 DIAGNOSIS — R112 Nausea with vomiting, unspecified: Secondary | ICD-10-CM | POA: Insufficient documentation

## 2018-01-29 DIAGNOSIS — Z79899 Other long term (current) drug therapy: Secondary | ICD-10-CM | POA: Insufficient documentation

## 2018-01-29 DIAGNOSIS — R1013 Epigastric pain: Secondary | ICD-10-CM | POA: Insufficient documentation

## 2018-01-29 LAB — COMPREHENSIVE METABOLIC PANEL
ALK PHOS: 57 U/L (ref 38–126)
ALT: 17 U/L (ref 14–54)
AST: 21 U/L (ref 15–41)
Albumin: 4.3 g/dL (ref 3.5–5.0)
Anion gap: 15 (ref 5–15)
BILIRUBIN TOTAL: 0.9 mg/dL (ref 0.3–1.2)
BUN: 10 mg/dL (ref 6–20)
CHLORIDE: 102 mmol/L (ref 101–111)
CO2: 20 mmol/L — ABNORMAL LOW (ref 22–32)
CREATININE: 0.63 mg/dL (ref 0.44–1.00)
Calcium: 9.7 mg/dL (ref 8.9–10.3)
GFR calc Af Amer: >60 mL/min (ref >60)
Glucose, Bld: 129 mg/dL — ABNORMAL HIGH (ref 65–99)
Potassium: 3.7 mmol/L (ref 3.5–5.1)
Sodium: 137 mmol/L (ref 135–145)
Total Protein: 7.8 g/dL (ref 6.5–8.1)

## 2018-01-29 LAB — CBC
HCT: 42.9 % (ref 36.0–46.0)
Hemoglobin: 14.6 g/dL (ref 12.0–15.0)
MCH: 28.4 pg (ref 26.0–34.0)
MCHC: 34 g/dL (ref 30.0–36.0)
MCV: 83.5 fL (ref 78.0–100.0)
PLATELETS: 358 10*3/uL (ref 150–400)
RBC: 5.14 MIL/uL — ABNORMAL HIGH (ref 3.87–5.11)
RDW: 13 % (ref 11.5–15.5)
WBC: 10.9 10*3/uL — AB (ref 4.0–10.5)

## 2018-01-29 LAB — I-STAT BETA HCG BLOOD, ED (MC, WL, AP ONLY): I-stat hCG, quantitative: <5 m[IU]/mL (ref <5)

## 2018-01-29 LAB — DIFFERENTIAL
Basophils Absolute: 0 10*3/uL (ref 0.0–0.1)
Basophils Relative: 0 %
EOS ABS: 0.1 10*3/uL (ref 0.0–0.7)
EOS PCT: 1 %
LYMPHS ABS: 1 10*3/uL (ref 0.7–4.0)
Lymphocytes Relative: 10 %
MONOS PCT: 5 %
Monocytes Absolute: 0.6 10*3/uL (ref 0.1–1.0)
NEUTROS PCT: 84 %
Neutro Abs: 9.2 10*3/uL — ABNORMAL HIGH (ref 1.7–7.7)

## 2018-01-29 LAB — LIPASE, BLOOD: Lipase: 23 U/L (ref 11–51)

## 2018-01-29 MED ORDER — ONDANSETRON HCL 4 MG/2ML IJ SOLN
4.0000 mg | Freq: Once | INTRAMUSCULAR | Status: AC
  Administered 2018-01-29: 4 mg via INTRAVENOUS
  Filled 2018-01-29: qty 2

## 2018-01-29 MED ORDER — FAMOTIDINE 20 MG PO TABS
20.0000 mg | ORAL_TABLET | Freq: Once | ORAL | Status: AC
  Administered 2018-01-29: 20 mg via ORAL
  Filled 2018-01-29: qty 1

## 2018-01-29 MED ORDER — GI COCKTAIL ~~LOC~~
30.0000 mL | Freq: Once | ORAL | Status: AC
  Administered 2018-01-29: 30 mL via ORAL
  Filled 2018-01-29: qty 30

## 2018-01-29 MED ORDER — SODIUM CHLORIDE 0.9 % IV BOLUS
1000.0000 mL | Freq: Once | INTRAVENOUS | Status: AC
  Administered 2018-01-29: 1000 mL via INTRAVENOUS

## 2018-01-29 MED ORDER — LOPERAMIDE HCL 2 MG PO CAPS
4.0000 mg | ORAL_CAPSULE | Freq: Once | ORAL | Status: AC
  Administered 2018-01-29: 4 mg via ORAL
  Filled 2018-01-29: qty 2

## 2018-01-29 MED ORDER — METOCLOPRAMIDE HCL 10 MG PO TABS: 10.0000 mg | ORAL_TABLET | Freq: Four times a day (QID) | ORAL | 0 refills | Status: DC | PRN

## 2018-01-29 NOTE — ED Provider Notes (Signed)
[facility] Provider Note   CSN: [id number] Arrival date & time: 01/29/18  0544     History   Chief Complaint Chief Complaint  Patient presents with  . Emesis  . Abdominal Pain    HPI Stephanie Floyd is a 22 y.o. female.  The history is provided by the patient.  She has a history of reflux gastritis and comes in with onset yesterday afternoon vomiting and diarrhea.  There is associated upper abdominal pain which she rates at 8/10.  Pain is not affected by vomiting.  Anytime she drinks something, even just a sip, she follows with vomiting and additional diarrhea.  She has not noticed any blood or mucus in stool or emesis.  She denies any sick contacts and denies any unusual food exposure.  She has not done anything to treat her symptoms.  Past Medical History:  Diagnosis Date  . Reflux gastritis     There are no active problems to display for this patient.   History reviewed. No pertinent surgical history.   OB History   None      Home Medications    Prior to Admission medications   Medication Sig Start Date End Date Taking? Authorizing Provider  cephALEXin (KEFLEX) 500 MG capsule Take 1 capsule (500 mg total) by mouth 4 (four) times daily. 07/23/17   Providence Lanius A, PA-C  miconazole (MONISTAT 1 COMBINATION PACK) kit Place 1 each vaginally once.    [provider]  Multiple Vitamin (MULTIVITAMIN) tablet Take 1 tablet by mouth daily.    [provider]  norethindrone-ethinyl estradiol 1/35 ([place] 1/35, 28,) tablet Take 1 tablet by mouth daily. 02/14/17   Lysbeth Penner, FNP  pantoprazole (PROTONIX) 20 MG tablet Take 1 tablet (20 mg total) by mouth daily. 05/17/17   Vanessa Kick, MD  sucralfate (CARAFATE) 1 GM/10ML suspension Take 10 mLs (1 g total) by mouth 4 (four) times daily -  with meals and at bedtime. Patient not taking: Reported on 07/23/2017 05/17/17   Vanessa Kick, MD    Family  History History reviewed. No pertinent family history.  Social History Social History   Tobacco Use  . Smoking status: Never Smoker  . Smokeless tobacco: Never Used  Substance Use Topics  . Alcohol use: No  . Drug use: No     Allergies   Patient has no known allergies.   Review of Systems Review of Systems  All other systems reviewed and are negative.    Physical Exam Updated Vital Signs BP (!) 122/59 (BP Location: Right Arm)   Pulse (!) 122   Temp 97.8 F (36.6 C) (Oral)   Resp 16   Ht 5' 1"  (1.549 m)   Wt 67.1 kg (148 lb)   LMP 01/04/2018   SpO2 98%   BMI 27.96 kg/m   Physical Exam  Nursing note and vitals reviewed.  22 year old female, resting comfortably and in no acute distress. Vital signs are significant for tachycardia. Oxygen saturation is 98%, which is normal. Head is normocephalic and atraumatic. PERRLA, EOMI. Oropharynx is clear. Neck is nontender and supple without adenopathy or JVD. Back is nontender and there is no CVA tenderness. Lungs are clear without rales, wheezes, or rhonchi. Chest is nontender. Heart is tachycardic without murmur. Abdomen is soft, flat, with mild epigastric tenderness.  There is no rebound or guarding.  There are no masses or hepatosplenomegaly and peristalsis is hypoactive. Extremities have no cyanosis or edema, full range of  motion is present. Skin is warm and dry without rash. Neurologic: Mental status is normal, cranial nerves are intact, there are no motor or sensory deficits.  ED Treatments / Results  Labs (all labs ordered are listed, but only abnormal results are displayed) Labs Reviewed  COMPREHENSIVE METABOLIC PANEL - Abnormal; Notable for the following components:      Result Value   CO2 20 (*)    Glucose, Bld 129 (*)    All other components within normal limits  CBC - Abnormal; Notable for the following components:   WBC 10.9 (*)    RBC 5.14 (*)    All other components within normal limits   DIFFERENTIAL - Abnormal; Notable for the following components:   Neutro Abs 9.2 (*)    All other components within normal limits  LIPASE, BLOOD  URINALYSIS, ROUTINE W REFLEX MICROSCOPIC  I-STAT BETA HCG BLOOD, ED ([facility], [facility], [facility] ONLY)   Procedures Procedures   Medications Ordered in ED Medications  ondansetron (ZOFRAN) injection 4 mg (4 mg Intravenous Given 01/29/18 0636)  sodium chloride 0.9 % bolus 1,000 mL (0 mLs Intravenous Stopped 01/29/18 0738)  loperamide (IMODIUM) capsule 4 mg (4 mg Oral Given 01/29/18 0641)  gi cocktail (Maalox,Lidocaine,Donnatal) (30 mLs Oral Given 01/29/18 0746)  famotidine (PEPCID) tablet 20 mg (20 mg Oral Given 01/29/18 0746)     Initial Impression / Assessment and Plan / ED Course  I have reviewed the triage vital signs and the nursing notes.  Pertinent lab results that were available during my care of the patient were reviewed by me and considered in my medical decision making (see chart for details).  Nausea, vomiting, diarrhea.  Pattern is strongly suggestive of viral gastroenteritis.  Old records are reviewed, and she does have a prior ED visit for gastritis.  However, appear gastritis should not have associated diarrhea.  She will be given IV fluids, ondansetron, and oral loperamide.  Screening labs are ordered.  She feels significantly better after above-noted treatment, but still complaining of some epigastric discomfort.  She is given GI cocktail a dose of oral famotidine.  Labs are reassuring.  She is discharged with prescription for metoclopramide for nausea and advised to use over-the-counter ranitidine or famotidine.  Return precautions discussed.  Final Clinical Impressions(s) / ED Diagnoses   Final diagnoses:  Nausea vomiting and diarrhea  Epigastric pain  ED Discharge Orders        Ordered    metoCLOPramide (REGLAN) 10 MG tablet  Every 6 hours PRN     62/37/62 8315       Delora Fuel, MD 17/61/60 [phone]

## 2018-01-29 NOTE — Discharge Instructions (Addendum)
Take loperamide (Imodium AD) as needed for diarrhea.   For the next week, take either famotidine (Pepcid AC) or ranitidine (Zantac) twice a day.

## 2018-01-29 NOTE — ED Triage Notes (Signed)
Pt coming from home. Pt started to have abdominal pain RUQ with projectile vomiting x7  and diarrhea x7. Pt states she can't keep anything. Pt states this feel like gastritis and pt has hx of it. Pt is axo x4.

## 2018-07-08 ENCOUNTER — Other Ambulatory Visit: Payer: Self-pay

## 2018-07-08 ENCOUNTER — Emergency Department (HOSPITAL_COMMUNITY)
Admission: EM | Admit: 2018-07-08 | Discharge: 2018-07-08 | Disposition: A | Discharge status: Home / Self Care | Payer: Self-pay | Attending: Emergency Medicine | Admitting: Emergency Medicine

## 2018-07-08 ENCOUNTER — Encounter (HOSPITAL_COMMUNITY): Payer: Self-pay | Admitting: Emergency Medicine

## 2018-07-08 DIAGNOSIS — R300 Dysuria: Secondary | ICD-10-CM

## 2018-07-08 DIAGNOSIS — Z79899 Other long term (current) drug therapy: Secondary | ICD-10-CM | POA: Insufficient documentation

## 2018-07-08 DIAGNOSIS — R1084 Generalized abdominal pain: Secondary | ICD-10-CM | POA: Insufficient documentation

## 2018-07-08 LAB — URINALYSIS, ROUTINE W REFLEX MICROSCOPIC
Bilirubin Urine: NEGATIVE
GLUCOSE, UA: NEGATIVE mg/dL
Hgb urine dipstick: NEGATIVE
KETONES UR: NEGATIVE mg/dL
Leukocytes, UA: NEGATIVE
NITRITE: POSITIVE — AB
PH: 5 (ref 5.0–8.0)
Protein, ur: NEGATIVE mg/dL
Specific Gravity, Urine: 1.014 (ref 1.005–1.030)

## 2018-07-08 LAB — CBC
HEMATOCRIT: 38.3 % (ref 36.0–46.0)
HEMOGLOBIN: 12.6 g/dL (ref 12.0–15.0)
MCH: 27.5 pg (ref 26.0–34.0)
MCHC: 32.9 g/dL (ref 30.0–36.0)
MCV: 83.6 fL (ref 78.0–100.0)
Platelets: 393 10*3/uL (ref 150–400)
RBC: 4.58 MIL/uL (ref 3.87–5.11)
RDW: 12.6 % (ref 11.5–15.5)
WBC: 6.8 10*3/uL (ref 4.0–10.5)

## 2018-07-08 LAB — BASIC METABOLIC PANEL
ANION GAP: 8 (ref 5–15)
BUN: 9 mg/dL (ref 6–20)
CHLORIDE: 106 mmol/L (ref 98–111)
CO2: 23 mmol/L (ref 22–32)
Calcium: 9.4 mg/dL (ref 8.9–10.3)
Creatinine, Ser: 0.57 mg/dL (ref 0.44–1.00)
GFR calc non Af Amer: >60 mL/min (ref >60)
GLUCOSE: 110 mg/dL — AB (ref 70–99)
POTASSIUM: 4 mmol/L (ref 3.5–5.1)
Sodium: 137 mmol/L (ref 135–145)

## 2018-07-08 LAB — I-STAT BETA HCG BLOOD, ED (MC, WL, AP ONLY): I-stat hCG, quantitative: <5 m[IU]/mL (ref <5)

## 2018-07-08 NOTE — ED Provider Notes (Signed)
[facility] EMERGENCY DEPARTMENT Provider Note   CSN: [id number] Arrival date & time: 07/08/18  0945     History   Chief Complaint Chief Complaint  Patient presents with  . Abdominal Pain  . Urinary Frequency    HPI Stephanie Floyd is a 22 y.o. female.  HPI   23 year old female presents today with complaints of dysuria and abdominal pain.  Patient notes a past medical history of constipation reporting she has to take laxatives on a daily basis.  She notes recently she went to [place] and returned approximately 1 week ago.  She notes during that time she was not taking her MiraLAX.  She notes after returning several family members had GI illness which has improved.  She notes she has had intermittent abdominal cramping worse after eating, located in the mid abdominal region.  She notes her stools have been soft, nonbloody.  She denies any vaginal discharge or bleeding.  She notes urinary frequency and dysuria.  She notes the symptoms are similar to episodes in the past they were unrelated to a urinary tract infection.  She notes she felt feverish last night, none today, denies any vomiting.  She notes taking AZO prior to arrival here today.  Past Medical History:  Diagnosis Date  . Reflux gastritis     There are no active problems to display for this patient.   History reviewed. No pertinent surgical history.   OB History   None      Home Medications    Prior to Admission medications   Medication Sig Start Date End Date Taking? Authorizing Provider  metoCLOPramide (REGLAN) 10 MG tablet Take 1 tablet (10 mg total) by mouth every 6 (six) hours as needed for nausea (or headache). 01/29/18   Dione Booze, MD  Multiple Vitamin (MULTIVITAMIN) tablet Take 1 tablet by mouth daily.    [provider]    Family History No family history on file.  Social History Social History   Tobacco Use  . Smoking status: Never Smoker  . Smokeless tobacco:  Never Used  Substance Use Topics  . Alcohol use: No  . Drug use: No     Allergies   Patient has no known allergies.   Review of Systems Review of Systems  All other systems reviewed and are negative.  Physical Exam Updated Vital Signs BP 109/68 (BP Location: Right Arm)   Pulse 93   Temp 98.6 F (37 C) (Oral)   Resp 16   LMP 06/17/2018   SpO2 99%   Physical Exam  Constitutional: She is oriented to person, place, and time. She appears well-developed and well-nourished.  HENT:  Head: Normocephalic and atraumatic.  Eyes: Pupils are equal, round, and reactive to light. Conjunctivae are normal. Right eye exhibits no discharge. Left eye exhibits no discharge. No scleral icterus.  Neck: Normal range of motion. No JVD present. No tracheal deviation present.  Pulmonary/Chest: Effort normal. No stridor.  Abdominal:  Minimal epigastric and mid abd ttp, remained of abd soft NTTP  Neurological: She is alert and oriented to person, place, and time. Coordination normal.  Psychiatric: She has a normal mood and affect. Her behavior is normal. Judgment and thought content normal.  Nursing note and vitals reviewed.   ED Treatments / Results  Labs (all labs ordered are listed, but only abnormal results are displayed) Labs Reviewed  URINALYSIS, ROUTINE W REFLEX MICROSCOPIC - Abnormal; Notable for the following components:      Result Value  Color, Urine AMBER (*)    Nitrite POSITIVE (*)    Bacteria, UA RARE (*)    All other components within normal limits  BASIC METABOLIC PANEL - Abnormal; Notable for the following components:   Glucose, Bld 110 (*)    All other components within normal limits  URINE CULTURE  CBC  I-STAT BETA HCG BLOOD, ED ([facility], [facility], [facility] ONLY)    EKG None  Radiology No results found.  Procedures Procedures (including critical care time)  Medications Ordered in ED Medications - No data to display   Initial Impression / Assessment and Plan / ED Course  I  have reviewed the triage vital signs and the nursing notes.  Pertinent labs & imaging results that were available during my care of the patient were reviewed by me and considered in my medical decision making (see chart for details).    Labs: I-STAT beta-hCG, BMP, CBC, urinalysis and urine culture  Imaging:  Consults:  Therapeutics:  Discharge Meds:   Assessment/Plan: 22 year old female presents today with complaints of abdominal cramping and dysuria.  She has reassuring exam including no signs of tachycardia fever or elevation in white count.  She has a nonfocal abdominal exam with no severe abdominal tenderness and appears generally well.  No signs of acute cholecystitis, appendicitis, diverticulitis.  Patient's urine does show nitrite positive urine but negative for leukocytes and rare bacteria, she did take AZO, lower suspicion for urinary tract infection.  Do not feel patient would benefit from antibiotics at this time given results but urine will be sent.  Patient is encouraged to avoid excessive use of laxatives, I have encouraged her to use Metamucil, MiraLAX only as needed for constipation.  Patient will return if she develops any new or worsening signs or symptoms, patient verbalized understanding and agreement to today's plan had no further questions or concerns the time discharge.   Final Clinical Impressions(s) / ED Diagnoses   Final diagnoses:  Dysuria  Generalized abdominal pain    ED Discharge Orders    None       Rosalio Loud 07/08/18 1303    Tegeler, Canary Brim, MD 07/08/18 [phone]

## 2018-07-08 NOTE — ED Triage Notes (Addendum)
Pt reports she takes miralax every day to every other day (prescribed by doctor), states that her stomach feels very full and she feels nauseated since last Monday. Last did miralax 6 days ago. Last bm was today but states it was watery. Also reports urinary frequency and pain since yesterday- took azo

## 2018-07-08 NOTE — ED Notes (Signed)
Got patient undress patient is resting with call bell in reach

## 2018-07-08 NOTE — Discharge Instructions (Addendum)
Please read attached information. If you experience any new or worsening signs or symptoms please return to the emergency room for evaluation. Please follow-up with your primary care provider or specialist as discussed. Please use medication prescribed only as directed and discontinue taking if you have any concerning signs or symptoms.  Please use metamucil daily.

## 2018-07-09 LAB — URINE CULTURE

## 2020-03-13 ENCOUNTER — Other Ambulatory Visit: Payer: Self-pay

## 2020-03-13 DIAGNOSIS — N3090 Cystitis, unspecified without hematuria: Secondary | ICD-10-CM | POA: Insufficient documentation

## 2020-03-14 ENCOUNTER — Encounter (HOSPITAL_COMMUNITY): Payer: Self-pay | Admitting: *Deleted

## 2020-03-14 ENCOUNTER — Emergency Department (HOSPITAL_COMMUNITY)
Admission: EM | Admit: 2020-03-14 | Discharge: 2020-03-14 | Disposition: A | Discharge status: Home / Self Care | Payer: Self-pay | Attending: Emergency Medicine | Admitting: Emergency Medicine

## 2020-03-14 ENCOUNTER — Emergency Department (HOSPITAL_COMMUNITY): Payer: Self-pay

## 2020-03-14 ENCOUNTER — Other Ambulatory Visit: Payer: Self-pay

## 2020-03-14 DIAGNOSIS — N3090 Cystitis, unspecified without hematuria: Secondary | ICD-10-CM

## 2020-03-14 LAB — URINALYSIS, ROUTINE W REFLEX MICROSCOPIC
Bilirubin Urine: NEGATIVE
Glucose, UA: NEGATIVE mg/dL
Ketones, ur: NEGATIVE mg/dL
Nitrite: NEGATIVE
Protein, ur: NEGATIVE mg/dL
Specific Gravity, Urine: 1.001 — ABNORMAL LOW (ref 1.005–1.030)
pH: 6 (ref 5.0–8.0)

## 2020-03-14 LAB — COMPREHENSIVE METABOLIC PANEL
ALT: 21 U/L (ref 0–44)
AST: 25 U/L (ref 15–41)
Albumin: 4 g/dL (ref 3.5–5.0)
Alkaline Phosphatase: 65 U/L (ref 38–126)
Anion gap: 9 (ref 5–15)
BUN: 9 mg/dL (ref 6–20)
CO2: 23 mmol/L (ref 22–32)
Calcium: 9.2 mg/dL (ref 8.9–10.3)
Chloride: 107 mmol/L (ref 98–111)
Creatinine, Ser: 0.74 mg/dL (ref 0.44–1.00)
GFR calc Af Amer: >60 mL/min (ref >60)
GFR calc non Af Amer: >60 mL/min (ref >60)
Glucose, Bld: 107 mg/dL — ABNORMAL HIGH (ref 70–99)
Potassium: 3.6 mmol/L (ref 3.5–5.1)
Sodium: 139 mmol/L (ref 135–145)
Total Bilirubin: 0.3 mg/dL (ref 0.3–1.2)
Total Protein: 7.3 g/dL (ref 6.5–8.1)

## 2020-03-14 LAB — WET PREP, GENITAL
Clue Cells Wet Prep HPF POC: NONE SEEN
Sperm: NONE SEEN
Trich, Wet Prep: NONE SEEN
Yeast Wet Prep HPF POC: NONE SEEN

## 2020-03-14 LAB — LIPASE, BLOOD: Lipase: 26 U/L (ref 11–51)

## 2020-03-14 LAB — CBC
HCT: 39.8 % (ref 36.0–46.0)
Hemoglobin: 12.8 g/dL (ref 12.0–15.0)
MCH: 27.2 pg (ref 26.0–34.0)
MCHC: 32.2 g/dL (ref 30.0–36.0)
MCV: 84.5 fL (ref 80.0–100.0)
Platelets: 410 10*3/uL — ABNORMAL HIGH (ref 150–400)
RBC: 4.71 MIL/uL (ref 3.87–5.11)
RDW: 14.2 % (ref 11.5–15.5)
WBC: 10.2 10*3/uL (ref 4.0–10.5)
nRBC: 0 % (ref 0.0–0.2)

## 2020-03-14 LAB — I-STAT BETA HCG BLOOD, ED (MC, WL, AP ONLY): I-stat hCG, quantitative: <5 m[IU]/mL (ref <5)

## 2020-03-14 MED ORDER — CEPHALEXIN 250 MG PO CAPS
500.0000 mg | ORAL_CAPSULE | Freq: Once | ORAL | Status: AC
  Administered 2020-03-14: 500 mg via ORAL
  Filled 2020-03-14: qty 2

## 2020-03-14 MED ORDER — IOHEXOL 300 MG/ML  SOLN
100.0000 mL | Freq: Once | INTRAMUSCULAR | Status: AC | PRN
  Administered 2020-03-14: 100 mL via INTRAVENOUS

## 2020-03-14 MED ORDER — SODIUM CHLORIDE 0.9% FLUSH
3.0000 mL | Freq: Once | INTRAVENOUS | Status: AC
  Administered 2020-03-14: 3 mL via INTRAVENOUS

## 2020-03-14 MED ORDER — SODIUM CHLORIDE 0.9 % IV BOLUS
1000.0000 mL | Freq: Once | INTRAVENOUS | Status: AC
  Administered 2020-03-14: 1000 mL via INTRAVENOUS

## 2020-03-14 MED ORDER — CEPHALEXIN 500 MG PO CAPS: 500.0000 mg | ORAL_CAPSULE | Freq: Two times a day (BID) | ORAL | 0 refills | Status: DC

## 2020-03-14 NOTE — ED Notes (Signed)
Lab to add on culture to urine sample previously sent

## 2020-03-14 NOTE — ED Notes (Signed)
Patient verbalizes understanding of discharge instructions. Opportunity for questioning and answers were provided. Armband removed by staff, pt discharged from ED stable & ambulatory  

## 2020-03-14 NOTE — ED Provider Notes (Signed)
[facility] EMERGENCY DEPARTMENT Provider Note   CSN: [id number] Arrival date & time: 03/13/20  2318     History Chief Complaint  Patient presents with  . Recurrent UTI    Stephanie Floyd is a 24 y.o. female.  Patient presents to the emergency department for evaluation of bladder pain, urinary frequency, severe pain with urination.  Patient reports that she has had frequent urinary tract infections in the past.  Patient reports that this feels similar.  She has been experiencing chills and sweats over the last 24 hours, has not taken her temperature to determine if she has had a fever.  Patient did take AZO and reports that while she was in the waiting room some of her bladder pain improved significantly.        Past Medical History:  Diagnosis Date  . Reflux gastritis     There are no problems to display for this patient.   History reviewed. No pertinent surgical history.   OB History   No obstetric history on file.     No family history on file.  Social History   Tobacco Use  . Smoking status: Never Smoker  . Smokeless tobacco: Never Used  Substance Use Topics  . Alcohol use: No  . Drug use: No    Home Medications Prior to Admission medications   Medication Sig Start Date End Date Taking? Authorizing Provider  cephALEXin (KEFLEX) 500 MG capsule Take 1 capsule (500 mg total) by mouth 2 (two) times daily. 03/14/20   Gilda Crease, MD  metoCLOPramide (REGLAN) 10 MG tablet Take 1 tablet (10 mg total) by mouth every 6 (six) hours as needed for nausea (or headache). Patient not taking: Reported on 03/14/2020 01/29/18   Dione Booze, MD    Allergies    Patient has no known allergies.  Review of Systems   Review of Systems  Gastrointestinal: Positive for abdominal pain.  Genitourinary: Positive for dysuria, frequency and urgency.  All other systems reviewed and are negative.   Physical Exam Updated Vital Signs BP 122/85 (BP  Location: Right Arm)   Pulse (!) 104   Temp 98.2 F (36.8 C) (Oral)   Resp 18   Ht 5\' 1"  (1.549 m)   Wt 72.6 kg   SpO2 100%   BMI 30.23 kg/m   Physical Exam Vitals and nursing note reviewed.  Constitutional:      General: She is not in acute distress.    Appearance: Normal appearance. She is well-developed.  HENT:     Head: Normocephalic and atraumatic.     Right Ear: Hearing normal.     Left Ear: Hearing normal.     Nose: Nose normal.  Eyes:     Conjunctiva/sclera: Conjunctivae normal.     Pupils: Pupils are equal, round, and reactive to light.  Cardiovascular:     Rate and Rhythm: Regular rhythm.     Heart sounds: S1 normal and S2 normal. No murmur. No friction rub. No gallop.   Pulmonary:     Effort: Pulmonary effort is normal. No respiratory distress.     Breath sounds: Normal breath sounds.  Chest:     Chest wall: No tenderness.  Abdominal:     General: Bowel sounds are normal.     Palpations: Abdomen is soft.     Tenderness: There is abdominal tenderness in the suprapubic area. There is no guarding or rebound. Negative signs include Murphy's sign and McBurney's sign.     Hernia: No  hernia is present.  Musculoskeletal:        General: Normal range of motion.     Cervical back: Normal range of motion and neck supple.  Skin:    General: Skin is warm and dry.     Findings: No rash.  Neurological:     Mental Status: She is alert and oriented to person, place, and time.     GCS: GCS eye subscore is 4. GCS verbal subscore is 5. GCS motor subscore is 6.     Cranial Nerves: No cranial nerve deficit.     Sensory: No sensory deficit.     Coordination: Coordination normal.  Psychiatric:        Speech: Speech normal.        Behavior: Behavior normal.        Thought Content: Thought content normal.     ED Results / Procedures / Treatments   Labs (all labs ordered are listed, but only abnormal results are displayed) Labs Reviewed  COMPREHENSIVE METABOLIC PANEL -  Abnormal; Notable for the following components:      Result Value   Glucose, Bld 107 (*)    All other components within normal limits  CBC - Abnormal; Notable for the following components:   Platelets 410 (*)    All other components within normal limits  URINALYSIS, ROUTINE W REFLEX MICROSCOPIC - Abnormal; Notable for the following components:   Specific Gravity, Urine 1.001 (*)    Hgb urine dipstick LARGE (*)    Leukocytes,Ua TRACE (*)    Bacteria, UA RARE (*)    All other components within normal limits  WET PREP, GENITAL  URINE CULTURE  LIPASE, BLOOD  I-STAT BETA HCG BLOOD, ED ([facility], [facility], [facility] ONLY)  GC/CHLAMYDIA PROBE AMP ([place]) NOT AT [facility]    EKG None  Radiology CT ABDOMEN PELVIS W CONTRAST  Result Date: 03/14/2020 CLINICAL DATA:  Abdominal pain for 2 days. EXAM: CT ABDOMEN AND PELVIS WITH CONTRAST TECHNIQUE: Multidetector CT imaging of the abdomen and pelvis was performed using the standard protocol following bolus administration of intravenous contrast. CONTRAST:  OMNIPAQUE IOHEXOL 300 MG/ML  SOLN COMPARISON:  None. FINDINGS: Lower chest: No acute abnormality. Hepatobiliary: No focal liver abnormality is seen. No gallstones, gallbladder wall thickening, or biliary dilatation. Pancreas: Unremarkable. No pancreatic ductal dilatation or surrounding inflammatory changes. Spleen: Normal in size without focal abnormality. Adrenals/Urinary Tract: Adrenal glands appear normal. Kidneys appear normal without mass, stone or hydronephrosis. No perinephric fluid. No ureteral or bladder calculi identified. Questionable mild bladder wall thickening. Stomach/Bowel: No dilated large or small bowel loops. No evidence of bowel wall inflammation. Appendix appears normal, partially visualized. Vascular/Lymphatic: No significant vascular findings are present. No enlarged abdominal or pelvic lymph nodes. Reproductive: Uterus and bilateral adnexa are unremarkable. Other: Sees no free fluid or  abscess collection. No free intraperitoneal air. Musculoskeletal: No acute or significant osseous finding. Superficial soft tissues are unremarkable. IMPRESSION: 1. Questionable mild bladder wall thickening. Recommend correlation with urinalysis to exclude cystitis. 2. Remainder of the abdomen and pelvis CT is unremarkable. No bowel obstruction or evidence of bowel wall inflammation. No evidence of acute solid organ abnormality. No renal or ureteral calculi. Appendix appears normal. Electronically Signed   By: Bary Richard M.D.   On: 03/14/2020 06:37    Procedures Procedures (including critical care time)  Medications Ordered in ED Medications  cephALEXin (KEFLEX) capsule 500 mg (has no administration in time range)  sodium chloride flush (NS) 0.9 % injection 3  mL (3 mLs Intravenous Given 03/14/20 0535)  sodium chloride 0.9 % bolus 1,000 mL (0 mLs Intravenous Stopped 03/14/20 0638)  iohexol (OMNIPAQUE) 300 MG/ML solution 100 mL (100 mLs Intravenous Contrast Given 03/14/20 3382)    ED Course  I have reviewed the triage vital signs and the nursing notes.  Pertinent labs & imaging results that were available during my care of the patient were reviewed by me and considered in my medical decision making (see chart for details).    MDM Rules/Calculators/A&P                      Patient presents to the emergency department for evaluation of urinary frequency and dysuria.  Patient reports frequent urinary tract infections in the past.  Urinalysis, however, did not look particularly infected at arrival.  She therefore underwent further evaluation.  Lab work was unremarkable.  Pelvic exam revealed tenderness in the area of the bladder without cervical motion tenderness or discharge.  Patient therefore underwent CT scan to ensure that she was not experiencing appendicitis or other infectious/inflammatory process causing her lower abdominal and pelvic pain.  She does have a thickened bladder wall that would  support diagnosis of UTI.  We will therefore treat empirically.  Final Clinical Impression(s) / ED Diagnoses Final diagnoses:  Cystitis    Rx / DC Orders ED Discharge Orders         Ordered    cephALEXin (KEFLEX) 500 MG capsule  2 times daily     03/14/20 0654           Orpah Greek, MD 03/14/20 [phone]

## 2020-03-14 NOTE — ED Notes (Signed)
pt endorses dysuria that began on monday after a heavy caffeine intake. She states that Tuesday she began having urinary frequency without painful urination. She is now having lower abdominal soreness and tenderness bilaterally as well as some lower back pain.  Endorses chills last night as well but did not check her temp at home. She has been taking AZO for her symptoms without relief. LMP was reportedly 3 days ago, however, she c/o light vaginal spotting tonight.

## 2020-03-14 NOTE — ED Triage Notes (Signed)
The pt has had a 2 day history  Of chills lower abd pain and maybe a temp  Severe pain with urination  lmp 2 days ago

## 2020-03-15 LAB — GC/CHLAMYDIA PROBE AMP (~~LOC~~) NOT AT ARMC
Chlamydia: NEGATIVE
Comment: NEGATIVE
Comment: NORMAL
Neisseria Gonorrhea: NEGATIVE

## 2020-03-16 LAB — URINE CULTURE: Culture: >=100000 — AB

## 2020-03-17 ENCOUNTER — Telehealth: Payer: Self-pay | Admitting: *Deleted

## 2020-03-17 NOTE — Telephone Encounter (Signed)
Post ED Visit - Positive Culture Follow-up  Culture report reviewed by antimicrobial stewardship pharmacist: [facility] []  , Pharm.D. []  Enzo Bi, Pharm.D., BCPS AQ-ID []  , Pharm.D., BCPS []  Celedonio Miyamoto, Pharm.D., BCPS []  [place], Garvin Fila.D., BCPS, AAHIVP []  , Pharm.D., BCPS, AAHIVP []  Georgina Pillion, PharmD, BCPS []  , PharmD, BCPS []  [place], PharmD, BCPS []  [address], PharmD []  , PharmD, BCPS []  Estella Husk, PharmD , PharmD  [facility] []  , PharmD []  Phillips Climes, PharmD []  , PharmD []  Agapito Games, Rph []  ) Verlan Friends, PharmD []  , PharmD []  Mervyn Gay, PharmD []  , PharmD []  Vinnie Level, PharmD []  Jettie Pagan, PharmD []  [facility], PharmD []  , PharmD []  Len Childs, PharmD   Positive urine culture Treated with Cephalexin, organism sensitive to the same and no further patient follow-up is required at this time.  [facility] 03/17/2020, 10:35 AM

## 2020-03-31 ENCOUNTER — Encounter (HOSPITAL_COMMUNITY): Payer: Self-pay

## 2020-03-31 ENCOUNTER — Other Ambulatory Visit: Payer: Self-pay

## 2020-03-31 ENCOUNTER — Ambulatory Visit (HOSPITAL_COMMUNITY)
Admission: EM | Admit: 2020-03-31 | Discharge: 2020-03-31 | Disposition: A | Discharge status: Home / Self Care | Payer: Self-pay | Attending: Emergency Medicine | Admitting: Emergency Medicine

## 2020-03-31 DIAGNOSIS — N3001 Acute cystitis with hematuria: Secondary | ICD-10-CM

## 2020-03-31 DIAGNOSIS — J302 Other seasonal allergic rhinitis: Secondary | ICD-10-CM

## 2020-03-31 LAB — POCT URINALYSIS DIP (DEVICE)
Bilirubin Urine: NEGATIVE
Glucose, UA: NEGATIVE mg/dL
Hgb urine dipstick: NEGATIVE
Ketones, ur: NEGATIVE mg/dL
Leukocytes,Ua: NEGATIVE
Nitrite: NEGATIVE
Protein, ur: NEGATIVE mg/dL
Specific Gravity, Urine: >=1.03 (ref 1.005–1.030)
Urobilinogen, UA: 0.2 mg/dL (ref 0.0–1.0)
pH: 6 (ref 5.0–8.0)

## 2020-03-31 MED ORDER — SULFAMETHOXAZOLE-TRIMETHOPRIM 800-160 MG PO TABS: 1.0000 | ORAL_TABLET | Freq: Two times a day (BID) | ORAL | 0 refills | Status: DC

## 2020-03-31 MED ORDER — LORATADINE 10 MG PO TABS: 10.0000 mg | ORAL_TABLET | Freq: Every day | ORAL | 1 refills | Status: DC

## 2020-03-31 NOTE — ED Provider Notes (Signed)
[facility]    CSN: [id number] Arrival date & time: 03/31/20  1611      History   Chief Complaint Chief Complaint  Patient presents with  . Urinary Tract Infection    HPI Stephanie Floyd is a 24 y.o. female.   5/16 seen for UTi placed on keflex, still cont to have frequency sx never completely went away. She has lower abd pressure. Denies any fever ,no n/v/d. Has been taking otc azo with no relief.   Feels like both ears have a full sensation and watery bil eyes intermit. More so  The rt ear with fluid. usually has allergy symptoms every year has not been taking anything      Past Medical History:  Diagnosis Date  . Reflux gastritis     There are no problems to display for this patient.   History reviewed. No pertinent surgical history.  OB History   No obstetric history on file.      Home Medications    Prior to Admission medications   Medication Sig Start Date End Date Taking? Authorizing Provider  senna (SENOKOT) 8.6 MG TABS tablet Take 1 tablet by mouth as needed for mild constipation.   Yes [provider]  cephALEXin (KEFLEX) 500 MG capsule Take 1 capsule (500 mg total) by mouth 2 (two) times daily. 03/14/20   Gilda Crease, MD  loratadine (CLARITIN) 10 MG tablet Take 1 tablet (10 mg total) by mouth daily. 03/31/20   Coralyn Mark, NP  metoCLOPramide (REGLAN) 10 MG tablet Take 1 tablet (10 mg total) by mouth every 6 (six) hours as needed for nausea (or headache). Patient not taking: Reported on 03/14/2020 01/29/18   Dione Booze, MD  sulfamethoxazole-trimethoprim (BACTRIM DS) 800-160 MG tablet Take 1 tablet by mouth 2 (two) times daily. 03/31/20   Coralyn Mark, NP    Family History No family history on file.  Social History Social History   Tobacco Use  . Smoking status: Never Smoker  . Smokeless tobacco: Never Used  Substance Use Topics  . Alcohol use: No  . Drug use: No     Allergies   Patient has no  known allergies.   Review of Systems Review of Systems  Constitutional: Negative.   HENT:       Fluid popping to bil ears intermit   Respiratory: Negative.   Cardiovascular: Negative.   Gastrointestinal: Positive for abdominal pain.  Genitourinary: Positive for difficulty urinating and frequency.     Physical Exam Triage Vital Signs ED Triage Vitals  Enc Vitals Group     BP 03/31/20 1711 114/62     Pulse Rate 03/31/20 1711 84     Resp 03/31/20 1711 16     Temp 03/31/20 1711 98.4 F (36.9 C)     Temp Source 03/31/20 1711 Oral     SpO2 --      Weight 03/31/20 1714 159 lb (72.1 kg)     Height 03/31/20 1714 5\' 2"  (1.575 m)     Head Circumference --      Peak Flow --      Pain Score 03/31/20 1714 0     Pain Loc --      Pain Edu? --      Excl. in GC? --    No data found.  Updated Vital Signs BP 114/62   Pulse 84   Temp 98.4 F (36.9 C) (Oral)   Resp 16   Ht 5\' 2"  (1.575 m)  Wt 159 lb (72.1 kg)   BMI 29.08 kg/m   Visual Acuity     Physical Exam HENT:     Right Ear: Tympanic membrane normal.     Left Ear: Tympanic membrane normal.     Nose: Nose normal.     Mouth/Throat:     Mouth: Mucous membranes are moist.  Eyes:     Pupils: Pupils are equal, round, and reactive to light.  Cardiovascular:     Rate and Rhythm: Normal rate.  Pulmonary:     Effort: Pulmonary effort is normal.  Abdominal:     Tenderness: There is abdominal tenderness. There is right CVA tenderness and left CVA tenderness.  Musculoskeletal:     Cervical back: Normal range of motion.  Neurological:     Mental Status: She is alert.      UC Treatments / Results  Labs (all labs ordered are listed, but only abnormal results are displayed) Labs Reviewed  URINE CULTURE  POCT URINALYSIS DIP (DEVICE)    EKG   Radiology No results found.  Procedures Procedures (including critical care time)  Medications Ordered in UC Medications - No data to display  Initial Impression /  Assessment and Plan / UC Course  I have reviewed the triage vital signs and the nursing notes.  Pertinent labs & imaging results that were available during my care of the patient were reviewed by me and considered in my medical decision making (see chart for details).    Will send off culture with any change please check your my chart  Take Claritin daily to help with any fluid or drainage to ears you do not have an infections seen today  Reviewed previous chart    Final Clinical Impressions(s) / UC Diagnoses   Final diagnoses:  Acute cystitis with hematuria  Seasonal allergies     Discharge Instructions     Will send off culture with any change please check your my chart  Take Claritin daily to help with any fluid or drainage to ears you do not have an infections seen today      ED Prescriptions    Medication Sig Dispense Auth. Provider   loratadine (CLARITIN) 10 MG tablet Take 1 tablet (10 mg total) by mouth daily. 30 tablet Morley Kos L, NP   sulfamethoxazole-trimethoprim (BACTRIM DS) 800-160 MG tablet Take 1 tablet by mouth 2 (two) times daily. 14 tablet Marney Setting, NP     PDMP not reviewed this encounter.   Marney Setting, NP 03/31/20 1800

## 2020-03-31 NOTE — Discharge Instructions (Addendum)
Will send off culture with any change please check your my chart  Take Claritin daily to help with any fluid or drainage to ears you do not have an infections seen today

## 2020-03-31 NOTE — ED Triage Notes (Signed)
Pt c/o frequent urination. Pt had a UTI 2 wks ago and got tx for it from ED, but still having frequent urination. Pt states has muffled hearing in left earx2wks. Pt states once a week it "pops open and she can hear a little then it gets clogged again."

## 2020-04-01 LAB — URINE CULTURE: Culture: <10000 — AB

## 2020-04-08 ENCOUNTER — Encounter (HOSPITAL_COMMUNITY): Payer: Self-pay

## 2020-04-08 ENCOUNTER — Other Ambulatory Visit: Payer: Self-pay

## 2020-04-08 ENCOUNTER — Ambulatory Visit (HOSPITAL_COMMUNITY)
Admission: EM | Admit: 2020-04-08 | Discharge: 2020-04-08 | Disposition: A | Discharge status: Home / Self Care | Payer: Self-pay | Attending: Urgent Care | Admitting: Urgent Care

## 2020-04-08 DIAGNOSIS — R350 Frequency of micturition: Secondary | ICD-10-CM | POA: Insufficient documentation

## 2020-04-08 DIAGNOSIS — N390 Urinary tract infection, site not specified: Secondary | ICD-10-CM

## 2020-04-08 DIAGNOSIS — Z3202 Encounter for pregnancy test, result negative: Secondary | ICD-10-CM

## 2020-04-08 LAB — POCT URINALYSIS DIP (DEVICE)
Bilirubin Urine: NEGATIVE
Glucose, UA: NEGATIVE mg/dL
Ketones, ur: NEGATIVE mg/dL
Leukocytes,Ua: NEGATIVE
Nitrite: NEGATIVE
Protein, ur: NEGATIVE mg/dL
Specific Gravity, Urine: 1.025 (ref 1.005–1.030)
Urobilinogen, UA: 0.2 mg/dL (ref 0.0–1.0)
pH: 6 (ref 5.0–8.0)

## 2020-04-08 LAB — POC URINE PREG, ED: Preg Test, Ur: NEGATIVE

## 2020-04-08 MED ORDER — PHENAZOPYRIDINE HCL 200 MG PO TABS: 200.0000 mg | ORAL_TABLET | Freq: Three times a day (TID) | ORAL | 0 refills | Status: DC | PRN

## 2020-04-08 NOTE — ED Triage Notes (Signed)
Pt is here with the frequent to urinate and was seen on 03/31/2020 with the same symptoms. Pt has taken the meds prescribed to her to relieve discomfort.

## 2020-04-08 NOTE — ED Provider Notes (Signed)
[facility]   MRN: [id number] DOB: Aug 05, 1996  Subjective:   Stephanie Floyd is a 24 y.o. female presenting for recheck on persistent urinary frequency and pelvic pressure, pain after urinating. Has had Keflex, Bactrim without any relief. Hydrates well, avoid urinary irritants. Had positive UTI, e. Coli on 03/14/2020. Keflex should have worked. However, still has residual symptoms. Had negative STI testing. Is not sexually active.   No current facility-administered medications for this encounter.  Current Outpatient Medications:  .  cephALEXin (KEFLEX) 500 MG capsule, Take 1 capsule (500 mg total) by mouth 2 (two) times daily., Disp: 14 capsule, Rfl: 0 .  loratadine (CLARITIN) 10 MG tablet, Take 1 tablet (10 mg total) by mouth daily., Disp: 30 tablet, Rfl: 1 .  metoCLOPramide (REGLAN) 10 MG tablet, Take 1 tablet (10 mg total) by mouth every 6 (six) hours as needed for nausea (or headache). (Patient not taking: Reported on 03/14/2020), Disp: 30 tablet, Rfl: 0 .  senna (SENOKOT) 8.6 MG TABS tablet, Take 1 tablet by mouth as needed for mild constipation., Disp: , Rfl:  .  sulfamethoxazole-trimethoprim (BACTRIM DS) 800-160 MG tablet, Take 1 tablet by mouth 2 (two) times daily., Disp: 14 tablet, Rfl: 0   No Known Allergies  Past Medical History:  Diagnosis Date  . Reflux gastritis      History reviewed. No pertinent surgical history.  History reviewed. No pertinent family history.  Social History   Tobacco Use  . Smoking status: Never Smoker  . Smokeless tobacco: Never Used  Substance Use Topics  . Alcohol use: No  . Drug use: No    ROS   Objective:   Vitals: BP 113/74 (BP Location: Right Arm)   Pulse 97   Temp 98.7 F (37.1 C) (Oral)   Resp 17   Wt 160 lb (72.6 kg)   SpO2 100%   BMI 29.26 kg/m   Physical Exam Constitutional:      General: She is not in acute distress.    Appearance: Normal appearance. She is well-developed. She is not  ill-appearing, toxic-appearing or diaphoretic.  HENT:     Head: Normocephalic and atraumatic.     Nose: Nose normal.     Mouth/Throat:     Mouth: Mucous membranes are moist.     Pharynx: Oropharynx is clear.  Eyes:     General: No scleral icterus.    Extraocular Movements: Extraocular movements intact.     Pupils: Pupils are equal, round, and reactive to light.  Cardiovascular:     Rate and Rhythm: Normal rate.  Pulmonary:     Effort: Pulmonary effort is normal.  Skin:    General: Skin is warm and dry.  Neurological:     General: No focal deficit present.     Mental Status: She is alert and oriented to person, place, and time.  Psychiatric:        Mood and Affect: Mood normal.        Behavior: Behavior normal.        Thought Content: Thought content normal.        Judgment: Judgment normal.     Results for orders placed or performed during the hospital encounter of 04/08/20 (from the past 24 hour(s))  POCT urinalysis dip (device)     Status: Abnormal   Collection Time: 04/08/20  3:32 PM  Result Value Ref Range   Glucose, UA NEGATIVE NEGATIVE mg/dL   Bilirubin Urine NEGATIVE NEGATIVE   Ketones, ur NEGATIVE NEGATIVE mg/dL  Specific Gravity, Urine 1.025 1.005 - 1.030   Hgb urine dipstick TRACE (A) NEGATIVE   pH 6.0 5.0 - 8.0   Protein, ur NEGATIVE NEGATIVE mg/dL   Urobilinogen, UA 0.2 0.0 - 1.0 mg/dL   Nitrite NEGATIVE NEGATIVE   Leukocytes,Ua NEGATIVE NEGATIVE  POC urine pregnancy     Status: None   Collection Time: 04/08/20  3:34 PM  Result Value Ref Range   Preg Test, Ur NEGATIVE NEGATIVE    Assessment and Plan :   PDMP not reviewed this encounter.  1. Urinary frequency     No evidence of UTI.  Recommended patient continue to hydrate well.  Avoid urinary irritants.  Urine culture pending.  Provided her with information to an urologist.  Use Pyridium in the meantime. Counseled patient on potential for adverse effects with medications prescribed/recommended  today, ER and return-to-clinic precautions discussed, patient verbalized understanding.    Wallis Bamberg, [place] 04/08/20 1548

## 2020-04-09 LAB — URINE CULTURE: Culture: NO GROWTH

## 2020-10-13 ENCOUNTER — Other Ambulatory Visit: Payer: Self-pay

## 2020-10-13 ENCOUNTER — Encounter: Payer: Self-pay | Admitting: Internal Medicine

## 2020-10-13 ENCOUNTER — Ambulatory Visit: Payer: 59 | Admitting: Internal Medicine

## 2020-10-13 VITALS — BP 110/72 | HR 92 | Temp 98.6°F | Resp 16 | Ht 62.0 in | Wt 161.0 lb

## 2020-10-13 DIAGNOSIS — Z23 Encounter for immunization: Secondary | ICD-10-CM | POA: Diagnosis not present

## 2020-10-13 DIAGNOSIS — K5904 Chronic idiopathic constipation: Secondary | ICD-10-CM | POA: Diagnosis not present

## 2020-10-13 DIAGNOSIS — F321 Major depressive disorder, single episode, moderate: Secondary | ICD-10-CM | POA: Diagnosis not present

## 2020-10-13 DIAGNOSIS — R635 Abnormal weight gain: Secondary | ICD-10-CM

## 2020-10-13 DIAGNOSIS — Z0001 Encounter for general adult medical examination with abnormal findings: Secondary | ICD-10-CM

## 2020-10-13 DIAGNOSIS — D75839 Thrombocytosis, unspecified: Secondary | ICD-10-CM | POA: Diagnosis not present

## 2020-10-13 DIAGNOSIS — Z1159 Encounter for screening for other viral diseases: Secondary | ICD-10-CM

## 2020-10-13 DIAGNOSIS — Z124 Encounter for screening for malignant neoplasm of cervix: Secondary | ICD-10-CM

## 2020-10-13 LAB — CBC WITH DIFFERENTIAL/PLATELET
Basophils Absolute: 0.1 10*3/uL (ref 0.0–0.1)
Basophils Relative: 1.2 % (ref 0.0–3.0)
Eosinophils Absolute: 0.1 10*3/uL (ref 0.0–0.7)
Eosinophils Relative: 1.5 % (ref 0.0–5.0)
HCT: 40.9 % (ref 36.0–46.0)
Hemoglobin: 13.8 g/dL (ref 12.0–15.0)
Lymphocytes Relative: 31 % (ref 12.0–46.0)
Lymphs Abs: 1.8 10*3/uL (ref 0.7–4.0)
MCHC: 33.7 g/dL (ref 30.0–36.0)
MCV: 80.7 fl (ref 78.0–100.0)
Monocytes Absolute: 0.5 10*3/uL (ref 0.1–1.0)
Monocytes Relative: 8.5 % (ref 3.0–12.0)
Neutro Abs: 3.3 10*3/uL (ref 1.4–7.7)
Neutrophils Relative %: 57.8 % (ref 43.0–77.0)
Platelets: 416 10*3/uL — ABNORMAL HIGH (ref 150.0–400.0)
RBC: 5.07 Mil/uL (ref 3.87–5.11)
RDW: 13.9 % (ref 11.5–15.5)
WBC: 5.8 10*3/uL (ref 4.0–10.5)

## 2020-10-13 LAB — HEPATIC FUNCTION PANEL
ALT: 12 U/L (ref 0–35)
AST: 17 U/L (ref 0–37)
Albumin: 4.5 g/dL (ref 3.5–5.2)
Alkaline Phosphatase: 60 U/L (ref 39–117)
Bilirubin, Direct: 0.1 mg/dL (ref 0.0–0.3)
Total Bilirubin: 0.4 mg/dL (ref 0.2–1.2)
Total Protein: 7.7 g/dL (ref 6.0–8.3)

## 2020-10-13 LAB — LIPID PANEL
Cholesterol: 157 mg/dL (ref 0–200)
HDL: 63.3 mg/dL (ref >39.00)
LDL Cholesterol: 73 mg/dL (ref 0–99)
NonHDL: 93.37
Total CHOL/HDL Ratio: 2
Triglycerides: 102 mg/dL (ref 0.0–149.0)
VLDL: 20.4 mg/dL (ref 0.0–40.0)

## 2020-10-13 LAB — BASIC METABOLIC PANEL
BUN: 8 mg/dL (ref 6–23)
CO2: 29 mEq/L (ref 19–32)
Calcium: 9.6 mg/dL (ref 8.4–10.5)
Chloride: 102 mEq/L (ref 96–112)
Creatinine, Ser: 0.63 mg/dL (ref 0.40–1.20)
GFR: 125.05 mL/min (ref >60.00)
Glucose, Bld: 82 mg/dL (ref 70–99)
Potassium: 3.7 mEq/L (ref 3.5–5.1)
Sodium: 137 mEq/L (ref 135–145)

## 2020-10-13 LAB — IRON: Iron: 144 ug/dL (ref 42–145)

## 2020-10-13 LAB — FERRITIN: Ferritin: 11 ng/mL (ref 10.0–291.0)

## 2020-10-13 LAB — TSH: TSH: 2.06 u[IU]/mL (ref 0.35–4.50)

## 2020-10-13 LAB — MAGNESIUM: Magnesium: 2 mg/dL (ref 1.5–2.5)

## 2020-10-13 NOTE — Patient Instructions (Signed)
Health Maintenance, Female Adopting a healthy lifestyle and getting preventive care are important in promoting health and wellness. Ask your health care provider about:  The right schedule for you to have regular tests and exams.  Things you can do on your own to prevent diseases and keep yourself healthy. What should I know about diet, weight, and exercise? Eat a healthy diet   Eat a diet that includes plenty of vegetables, fruits, low-fat dairy products, and lean protein.  Do not eat a lot of foods that are high in solid fats, added sugars, or sodium. Maintain a healthy weight Body mass index (BMI) is used to identify weight problems. It estimates body fat based on height and weight. Your health care provider can help determine your BMI and help you achieve or maintain a healthy weight. Get regular exercise Get regular exercise. This is one of the most important things you can do for your health. Most adults should:  Exercise for at least 150 minutes each week. The exercise should increase your heart rate and make you sweat (moderate-intensity exercise).  Do strengthening exercises at least twice a week. This is in addition to the moderate-intensity exercise.  Spend less time sitting. Even light physical activity can be beneficial. Watch cholesterol and blood lipids Have your blood tested for lipids and cholesterol at 23 years of age, then have this test every 5 years. Have your cholesterol levels checked more often if:  Your lipid or cholesterol levels are high.  You are older than 23 years of age.  You are at high risk for heart disease. What should I know about cancer screening? Depending on your health history and family history, you may need to have cancer screening at various ages. This may include screening for:  Breast cancer.  Cervical cancer.  Colorectal cancer.  Skin cancer.  Lung cancer. What should I know about heart disease, diabetes, and high blood  pressure? Blood pressure and heart disease  High blood pressure causes heart disease and increases the risk of stroke. This is more likely to develop in people who have high blood pressure readings, are of African descent, or are overweight.  Have your blood pressure checked: ? Every 3-5 years if you are 18-39 years of age. ? Every year if you are 40 years old or older. Diabetes Have regular diabetes screenings. This checks your fasting blood sugar level. Have the screening done:  Once every three years after age 40 if you are at a normal weight and have a low risk for diabetes.  More often and at a younger age if you are overweight or have a high risk for diabetes. What should I know about preventing infection? Hepatitis B If you have a higher risk for hepatitis B, you should be screened for this virus. Talk with your health care provider to find out if you are at risk for hepatitis B infection. Hepatitis C Testing is recommended for:  Everyone born from 1945 through 1965.  Anyone with known risk factors for hepatitis C. Sexually transmitted infections (STIs)  Get screened for STIs, including gonorrhea and chlamydia, if: ? You are sexually active and are younger than 24 years of age. ? You are older than 24 years of age and your health care provider tells you that you are at risk for this type of infection. ? Your sexual activity has changed since you were last screened, and you are at increased risk for chlamydia or gonorrhea. Ask your health care provider if   you are at risk.  Ask your health care provider about whether you are at high risk for HIV. Your health care provider may recommend a prescription medicine to help prevent HIV infection. If you choose to take medicine to prevent HIV, you should first get tested for HIV. You should then be tested every 3 months for as long as you are taking the medicine. Pregnancy  If you are about to stop having your period (premenopausal) and  you may become pregnant, seek counseling before you get pregnant.  Take 400 to 800 micrograms (mcg) of folic acid every day if you become pregnant.  Ask for birth control (contraception) if you want to prevent pregnancy. Osteoporosis and menopause Osteoporosis is a disease in which the bones lose minerals and strength with aging. This can result in bone fractures. If you are 65 years old or older, or if you are at risk for osteoporosis and fractures, ask your health care provider if you should:  Be screened for bone loss.  Take a calcium or vitamin D supplement to lower your risk of fractures.  Be given hormone replacement therapy (HRT) to treat symptoms of menopause. Follow these instructions at home: Lifestyle  Do not use any products that contain nicotine or tobacco, such as cigarettes, e-cigarettes, and chewing tobacco. If you need help quitting, ask your health care provider.  Do not use street drugs.  Do not share needles.  Ask your health care provider for help if you need support or information about quitting drugs. Alcohol use  Do not drink alcohol if: ? Your health care provider tells you not to drink. ? You are pregnant, may be pregnant, or are planning to become pregnant.  If you drink alcohol: ? Limit how much you use to 0-1 drink a day. ? Limit intake if you are breastfeeding.  Be aware of how much alcohol is in your drink. In the [place] one drink equals one 12 oz bottle of beer (355 mL), one 5 oz glass of wine (148 mL), or one 1 oz glass of hard liquor (44 mL). General instructions  Schedule regular health, dental, and eye exams.  Stay current with your vaccines.  Tell your health care provider if: ? You often feel depressed. ? You have ever been abused or do not feel safe at home. Summary  Adopting a healthy lifestyle and getting preventive care are important in promoting health and wellness.  Follow your health care provider's instructions about healthy  diet, exercising, and getting tested or screened for diseases.  Follow your health care provider's instructions on monitoring your cholesterol and blood pressure. This information is not intended to replace advice given to you by your health care provider. Make sure you discuss any questions you have with your health care provider. Document Revised: 10/09/2018 Document Reviewed: 10/09/2018 Elsevier Patient Education  2020 [employer] Inc[employer]  

## 2020-10-13 NOTE — Progress Notes (Signed)
Subjective:  Patient ID: Stephanie Floyd, female    DOB: 1996-04-28  Age: 24 y.o. MRN: [id number]  CC: Annual Exam  This visit occurred during the SARS-CoV-2 public health emergency.  Safety protocols were in place, including screening questions prior to the visit, additional usage of staff PPE, and extensive cleaning of exam room while observing appropriate contact time as indicated for disinfecting solutions.    HPI Stephanie Floyd presents for a CPX.  She complains of a 2-year history of irregular and light cycles.  She also complains of hair loss, insomnia, fatigue, weight gain, constipation, mood swings with anger and irritability.  She has a history of an elevated platelet count.  She denies lymphadenopathy, bleeding, bruising, or night sweats.  She is sexually active and uses condoms.  Outpatient Medications Prior to Visit  Medication Sig Dispense Refill  . cephALEXin (KEFLEX) 500 MG capsule Take 1 capsule (500 mg total) by mouth 2 (two) times daily. 14 capsule 0  . loratadine (CLARITIN) 10 MG tablet Take 1 tablet (10 mg total) by mouth daily. 30 tablet 1  . metoCLOPramide (REGLAN) 10 MG tablet Take 1 tablet (10 mg total) by mouth every 6 (six) hours as needed for nausea (or headache). 30 tablet 0  . phenazopyridine (PYRIDIUM) 200 MG tablet Take 1 tablet (200 mg total) by mouth 3 (three) times daily as needed for pain. 30 tablet 0  . senna (SENOKOT) 8.6 MG TABS tablet Take 1 tablet by mouth as needed for mild constipation.    . sulfamethoxazole-trimethoprim (BACTRIM DS) 800-160 MG tablet Take 1 tablet by mouth 2 (two) times daily. 14 tablet 0   No facility-administered medications prior to visit.    ROS Review of Systems  Constitutional: Positive for fatigue and unexpected weight change. Negative for appetite change, chills, diaphoresis and fever.  HENT: Negative.   Respiratory: Negative.  Negative for cough, chest tightness, shortness of breath and wheezing.    Cardiovascular: Negative for chest pain, palpitations and leg swelling.  Gastrointestinal: Positive for constipation. Negative for abdominal pain, blood in stool, diarrhea, nausea and vomiting.  Endocrine: Positive for cold intolerance. Negative for heat intolerance.  Genitourinary: Positive for menstrual problem. Negative for difficulty urinating, dysuria, hematuria, pelvic pain, vaginal bleeding and vaginal discharge.  Musculoskeletal: Negative.   Skin: Negative.  Negative for color change, pallor and rash.  Neurological: Negative.  Negative for dizziness, weakness, light-headedness, numbness and headaches.  Hematological: Negative for adenopathy. Does not bruise/bleed easily.  Psychiatric/Behavioral: Positive for dysphoric mood and sleep disturbance. Negative for agitation, behavioral problems, confusion, decreased concentration, hallucinations, self-injury and suicidal ideas. The patient is nervous/anxious. The patient is not hyperactive.     Objective:  BP 110/72   Pulse 92   Temp 98.6 F (37 C) (Oral)   Resp 16   Ht 5\' 2"  (1.575 m)   Wt 161 lb (73 kg)   LMP 10/01/2020   SpO2 98%   BMI 29.45 kg/m   BP Readings from Last 3 Encounters:  10/13/20 110/72  04/08/20 113/74  03/31/20 114/62    Wt Readings from Last 3 Encounters:  10/13/20 161 lb (73 kg)  04/08/20 160 lb (72.6 kg)  03/31/20 159 lb (72.1 kg)    Physical Exam Vitals reviewed.  Constitutional:      Appearance: Normal appearance.  HENT:     Nose: Nose normal.     Mouth/Throat:     Mouth: Mucous membranes are moist.  Eyes:     General: No scleral icterus.  Conjunctiva/sclera: Conjunctivae normal.  Cardiovascular:     Rate and Rhythm: Normal rate and regular rhythm.     Heart sounds: No murmur heard.   Pulmonary:     Effort: Pulmonary effort is normal.     Breath sounds: No stridor. No wheezing, rhonchi or rales.  Abdominal:     General: Abdomen is flat.     Palpations: There is no mass.      Tenderness: There is no abdominal tenderness. There is no guarding.  Musculoskeletal:        General: Normal range of motion.     Cervical back: Neck supple.     Right lower leg: No edema.     Left lower leg: No edema.  Lymphadenopathy:     Cervical: No cervical adenopathy.  Skin:    General: Skin is warm and dry.     Coloration: Skin is not pale.  Neurological:     General: No focal deficit present.     Mental Status: She is alert.  Psychiatric:        Mood and Affect: Mood normal.        Behavior: Behavior normal.     Lab Results  Component Value Date   WBC 5.8 10/13/2020   HGB 13.8 10/13/2020   HCT 40.9 10/13/2020   PLT 416.0 (H) 10/13/2020   GLUCOSE 82 10/13/2020   CHOL 157 10/13/2020   TRIG 102.0 10/13/2020   HDL 63.30 10/13/2020   LDLCALC 73 10/13/2020   ALT 12 10/13/2020   AST 17 10/13/2020   NA 137 10/13/2020   K 3.7 10/13/2020   CL 102 10/13/2020   CREATININE 0.63 10/13/2020   BUN 8 10/13/2020   CO2 29 10/13/2020   TSH 2.06 10/13/2020    No results found.  Assessment & Plan:   Trecia was seen today for annual exam.  Diagnoses and all orders for this visit:  Encounter for general adult medical examination with abnormal findings- Exam completed, labs reviewed, vaccines reviewed and updated, cancer screenings addressed, patient education was given. -     Lipid panel; Future -     Hepatitis C antibody; Future -     HIV Antibody (routine testing w rflx); Future -     HIV Antibody (routine testing w rflx) -     Hepatitis C antibody -     Lipid panel  Abnormal weight gain- Her labs are negative for secondary causes.  I am concerned that she is depressed so I recommended that she start taking duloxetine. -     Basic metabolic panel; Future -     TSH; Future -     Hepatic function panel; Future -     Hepatic function panel -     TSH -     Basic metabolic panel  Chronic idiopathic constipation- Her labs are negative for any secondary causes of  this. -     Magnesium; Future -     Magnesium  Cervical cancer screening -     Ambulatory referral to Gynecology  Thrombocytosis- Her platelet count remains mildly elevated.  She is asymptomatic with this.  Her iron level is normal.  She is not anemic and her other cell lines are normal.  This is likely benign.  I have asked her to come back in 3 to 4 months to have her platelet count rechecked. -     CBC with Differential/Platelet; Future -     Iron; Future -     Ferritin;  Future -     Basic metabolic panel; Future -     Hepatic function panel; Future -     Hepatic function panel -     Basic metabolic panel -     Ferritin -     Iron -     CBC with Differential/Platelet  Need for prophylactic vaccination and inoculation against influenza -     Flu Vaccine QUAD 6+ mos PF IM (Fluarix Quad PF)  Need for hepatitis C screening test -     Hepatitis C antibody; Future -     Hepatitis C antibody  Current moderate episode of major depressive disorder without prior episode (HCC)- I think this explains her symptoms.  I recommended that she start taking duloxetine at 30 mg a day.  I recommend that we increase the dose over the next few months. -     DULoxetine (CYMBALTA) 30 MG capsule; Take 1 capsule (30 mg total) by mouth daily.  Other orders -     Tdap vaccine greater than or equal to 7yo IM   I have discontinued Britta Mccreedy Yankey's metoCLOPramide, cephALEXin, senna, loratadine, sulfamethoxazole-trimethoprim, and phenazopyridine. I am also having her start on DULoxetine.  Meds ordered this encounter  Medications  . DULoxetine (CYMBALTA) 30 MG capsule    Sig: Take 1 capsule (30 mg total) by mouth daily.    Dispense:  30 capsule    Refill:  0   In addition to time spent on CPE, I spent 50 minutes in preparing to see the patient by review of recent labs, imaging and procedures, obtaining and reviewing separately obtained history, communicating with the patient and family or  caregiver, ordering medications, tests or procedures, and documenting clinical information in the EHR including the differential Dx, treatment, and any further evaluation and other management of 1. Abnormal weight gain 2. Chronic idiopathic constipation 3. Thrombocytosis 4. Current moderate episode of major depressive disorder without prior episode (HCC).     Follow-up: Return in about 3 months (around 01/11/2021).  Sanda Linger, MD

## 2020-10-15 LAB — HEPATITIS C ANTIBODY
Hepatitis C Ab: NONREACTIVE
SIGNAL TO CUT-OFF: 0.01 (ref <1.00)

## 2020-10-15 LAB — HIV ANTIBODY (ROUTINE TESTING W REFLEX): HIV 1&2 Ab, 4th Generation: NONREACTIVE

## 2020-10-16 DIAGNOSIS — F321 Major depressive disorder, single episode, moderate: Secondary | ICD-10-CM | POA: Insufficient documentation

## 2020-10-16 MED ORDER — DULOXETINE HCL 30 MG PO CPEP: 30.0000 mg | ORAL_CAPSULE | Freq: Every day | ORAL | 0 refills | Status: DC

## 2020-10-21 ENCOUNTER — Telehealth: Payer: Self-pay

## 2020-10-21 NOTE — Telephone Encounter (Signed)
Called pt, LVM with the below info.

## 2020-10-21 NOTE — Telephone Encounter (Signed)
-----   Message from Etta Grandchild, MD sent at 10/16/2020  1:13 PM EST ----- Regarding: antidepressant Pls let her know that I am concerned that she is depressed. I have sent an antidepressant to her pharmacy We can increase the dose over time  TJ

## 2020-12-06 ENCOUNTER — Other Ambulatory Visit: Payer: Self-pay

## 2020-12-06 ENCOUNTER — Ambulatory Visit (HOSPITAL_COMMUNITY)
Admission: EM | Admit: 2020-12-06 | Discharge: 2020-12-06 | Disposition: A | Discharge status: Home / Self Care | Payer: Managed Care, Other (non HMO) | Attending: Internal Medicine | Admitting: Internal Medicine

## 2020-12-06 ENCOUNTER — Telehealth: Payer: Self-pay | Admitting: Internal Medicine

## 2020-12-06 ENCOUNTER — Encounter (HOSPITAL_COMMUNITY): Payer: Self-pay

## 2020-12-06 DIAGNOSIS — Z3202 Encounter for pregnancy test, result negative: Secondary | ICD-10-CM | POA: Diagnosis not present

## 2020-12-06 LAB — POC URINE PREG, ED: Preg Test, Ur: NEGATIVE

## 2020-12-06 NOTE — Discharge Instructions (Addendum)
Your pregnancy test is negative.    Follow-up with your primary care provider or gynecologist as needed.

## 2020-12-06 NOTE — ED Provider Notes (Signed)
[facility]    CSN: [id number] Arrival date & time: 12/06/20  1810      History   Chief Complaint Chief Complaint  Patient presents with  . Possible Pregnancy    HPI Stephanie Floyd is a 25 y.o. female.  Patient presents with request for pregnancy test. She states her last menstrual cycle was on 10/23/2020. She is sexually active. She denies fever, abdominal pain, dysuria, seizure, vomiting, back pain, vaginal discharge, pelvic pain, or other symptoms. Her medical history includes chronic constipation, depression, reflux gastritis.  The history is provided by the patient and medical records.    Past Medical History:  Diagnosis Date  . Reflux gastritis   . Urine incontinence   . UTI (urinary tract infection)     Patient Active Problem List   Diagnosis Date Noted  . Current moderate episode of major depressive disorder without prior episode (HCC) 10/16/2020  . Chronic idiopathic constipation 10/13/2020  . Abnormal weight gain 10/13/2020  . Encounter for general adult medical examination with abnormal findings 10/13/2020  . Cervical cancer screening 10/13/2020  . Thrombocytosis 10/13/2020  . Need for hepatitis C screening test 10/13/2020  . Need for prophylactic vaccination and inoculation against influenza 10/13/2020    History reviewed. No pertinent surgical history.  OB History   No obstetric history on file.      Home Medications    Prior to Admission medications   Medication Sig Start Date End Date Taking? Authorizing Provider  DULoxetine (CYMBALTA) 30 MG capsule Take 1 capsule (30 mg total) by mouth daily. 10/16/20   Etta Grandchild, MD    Family History Family History  Problem Relation Age of Onset  . Depression Mother   . Mental illness Mother   . Hyperlipidemia Father   . Asthma Brother   . Cancer Maternal Grandmother   . Cancer Paternal Grandmother     Social History Social History   Tobacco Use  . Smoking status: Light  Tobacco Smoker    Types: E-cigarettes  . Smokeless tobacco: Never Used  Substance Use Topics  . Alcohol use: Yes    Alcohol/week: 6.0 standard drinks    Types: 3 Cans of beer, 3 Shots of liquor per week  . Drug use: No     Allergies   Patient has no known allergies.   Review of Systems Review of Systems  Constitutional: Negative for chills and fever.  HENT: Negative for ear pain and sore throat.   Eyes: Negative for pain and visual disturbance.  Respiratory: Negative for cough and shortness of breath.   Cardiovascular: Negative for chest pain and palpitations.  Gastrointestinal: Negative for abdominal pain, diarrhea, nausea and vomiting.  Genitourinary: Negative for dysuria and hematuria.  Musculoskeletal: Negative for arthralgias and back pain.  Skin: Negative for color change and rash.  Neurological: Negative for seizures and syncope.  All other systems reviewed and are negative.    Physical Exam Triage Vital Signs ED Triage Vitals  Enc Vitals Group     BP      Pulse      Resp      Temp      Temp src      SpO2      Weight      Height      Head Circumference      Peak Flow      Pain Score      Pain Loc      Pain Edu?  Excl. in GC?    No data found.  Updated Vital Signs BP 117/77 (BP Location: Right Arm)   Pulse 81   Temp 98.2 F (36.8 C) (Oral)   Resp 17   LMP 10/23/2020   SpO2 98%   Visual Acuity Right Eye Distance:   Left Eye Distance:   Bilateral Distance:    Right Eye Near:   Left Eye Near:    Bilateral Near:     Physical Exam Vitals and nursing note reviewed.  Constitutional:      General: She is not in acute distress.    Appearance: She is well-developed and well-nourished. She is not ill-appearing.  HENT:     Head: Normocephalic and atraumatic.     Mouth/Throat:     Mouth: Mucous membranes are moist.  Eyes:     Conjunctiva/sclera: Conjunctivae normal.  Cardiovascular:     Rate and Rhythm: Normal rate and regular rhythm.      Heart sounds: Normal heart sounds.  Pulmonary:     Effort: Pulmonary effort is normal. No respiratory distress.     Breath sounds: Normal breath sounds.  Abdominal:     General: Bowel sounds are normal.     Palpations: Abdomen is soft.     Tenderness: There is no abdominal tenderness. There is no guarding or rebound.  Musculoskeletal:        General: No edema.     Cervical back: Neck supple.  Skin:    General: Skin is warm and dry.  Neurological:     General: No focal deficit present.     Mental Status: She is alert and oriented to person, place, and time.     Gait: Gait normal.  Psychiatric:        Mood and Affect: Mood and affect and mood normal.        Behavior: Behavior normal.      UC Treatments / Results  Labs (all labs ordered are listed, but only abnormal results are displayed) Labs Reviewed  POC URINE PREG, ED    EKG   Radiology No results found.  Procedures Procedures (including critical care time)  Medications Ordered in UC Medications - No data to display  Initial Impression / Assessment and Plan / UC Course  I have reviewed the triage vital signs and the nursing notes.  Pertinent labs & imaging results that were available during my care of the patient were reviewed by me and considered in my medical decision making (see chart for details).   Encounter for pregnancy test. Urine pregnancy negative. Instructed patient to follow-up with her PCP or gynecologist as needed. She agrees to plan of care.   Final Clinical Impressions(s) / UC Diagnoses   Final diagnoses:  Pregnancy examination or test, negative result     Discharge Instructions     Your pregnancy test is negative.    Follow-up with your primary care provider or gynecologist as needed.        ED Prescriptions    None     PDMP not reviewed this encounter.   Mickie Bail, NP 12/06/20 [phone]

## 2020-12-06 NOTE — Telephone Encounter (Signed)
Yes, I am ok with this 

## 2020-12-06 NOTE — ED Triage Notes (Signed)
Pt presents for pregnancy testing after missed period.

## 2020-12-06 NOTE — Telephone Encounter (Signed)
Patient wondering if she could do a TOC to Dr. Okey Dupre because she would prefer to see a female.

## 2020-12-07 NOTE — Telephone Encounter (Signed)
LVM to schedule toc visit

## 2020-12-07 NOTE — Telephone Encounter (Signed)
Fine with me

## 2020-12-14 ENCOUNTER — Encounter: Payer: Managed Care, Other (non HMO) | Admitting: Nurse Practitioner

## 2021-01-11 ENCOUNTER — Ambulatory Visit: Payer: 59 | Admitting: Internal Medicine

## 2021-02-14 ENCOUNTER — Encounter: Payer: Self-pay | Admitting: Internal Medicine

## 2021-02-14 ENCOUNTER — Encounter: Payer: 59 | Admitting: Internal Medicine

## 2021-05-05 ENCOUNTER — Other Ambulatory Visit: Payer: Self-pay

## 2021-05-05 ENCOUNTER — Encounter (HOSPITAL_COMMUNITY): Payer: Self-pay | Admitting: Emergency Medicine

## 2021-05-05 ENCOUNTER — Ambulatory Visit (INDEPENDENT_AMBULATORY_CARE_PROVIDER_SITE_OTHER): Payer: Managed Care, Other (non HMO)

## 2021-05-05 ENCOUNTER — Ambulatory Visit (HOSPITAL_COMMUNITY)
Admission: EM | Admit: 2021-05-05 | Discharge: 2021-05-05 | Disposition: A | Discharge status: Home / Self Care | Payer: Managed Care, Other (non HMO) | Attending: Emergency Medicine | Admitting: Emergency Medicine

## 2021-05-05 DIAGNOSIS — R0602 Shortness of breath: Secondary | ICD-10-CM

## 2021-05-05 DIAGNOSIS — R059 Cough, unspecified: Secondary | ICD-10-CM | POA: Diagnosis not present

## 2021-05-05 HISTORY — DX: Unspecified asthma, uncomplicated: J45.909

## 2021-05-05 MED ORDER — GUAIFENESIN-DM 100-10 MG/5ML PO SYRP: 5.0000 mL | ORAL_SOLUTION | ORAL | 0 refills | Status: DC | PRN

## 2021-05-05 MED ORDER — BENZONATATE 100 MG PO CAPS: 100.0000 mg | ORAL_CAPSULE | Freq: Three times a day (TID) | ORAL | 0 refills | Status: DC

## 2021-05-05 MED ORDER — ALBUTEROL SULFATE HFA 108 (90 BASE) MCG/ACT IN AERS: 2.0000 | INHALATION_SPRAY | RESPIRATORY_TRACT | 0 refills | Status: DC | PRN

## 2021-05-05 NOTE — ED Triage Notes (Signed)
Patient reports her positive covid test was 3 weeks ago.  Patient has phlegm that she cannot get rid of.  Patient also vomits after eating and this makes her feel better per patient.  No runny nose, "gooey, wetness on back of throat".  Throat does not hurt, lungs feel tight

## 2021-05-05 NOTE — Discharge Instructions (Addendum)
Can use inhaler 2 puffs every 4 hours as needed for shortness of breath and chest tightness  Can use tessalon pill every 8 hours for coughing  Can use 22mL of syrup every 4 hours for cough and congestion  Try using medication 30 minutes to 1 hour before eating to see if that gives relief

## 2021-05-05 NOTE — ED Provider Notes (Addendum)
[facility]    CSN: [id number] Arrival date & time: 05/05/21  1700      History   Chief Complaint Chief Complaint  Patient presents with   Shortness of Breath    HPI Stephanie Floyd is a 25 y.o. female.   Patient presents with productive cough and " coughing fits" after eating causing her to feel like she can't breath and sometimes vomit her food and phlegm. Endorses constat chest tightness and pain with deep breathing. Deep breathing causes her to feel lightheaded. Has been feeling this way ever since having COVID three weeks ago. Denies fever, chills, body aches, nausea, headache, dizziness, ear pain or fullness, sore throat. The last two nights had borrowed her brothers red inhaler which gave some relief. History of asthma but has not taken medication since childhood. All  of her immediate family had covid three weeks ago as well.   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 No pertinent surgical history.  OB History   No obstetric history on file.      Home Medications    Prior to Admission medications   Medication Sig Start Date End Date Taking? Authorizing Provider  DULoxetine (CYMBALTA) 30 MG capsule Take 1 capsule (30 mg total) by mouth daily. Patient not taking: Reported on 05/05/2021 10/16/20   Etta Grandchild, MD    Family  History Family History  Problem Relation Age of Onset   Depression Mother    Mental illness Mother    Hyperlipidemia Father    Asthma Brother    Cancer Maternal Grandmother    Cancer Paternal Grandmother     Social History Social History   Tobacco Use   Smoking status: Some Days    Pack years: 0.00    Types: E-cigarettes   Smokeless tobacco: Never  Vaping Use   Vaping Use: Some days  Substance Use Topics   Alcohol use: Not Currently    Alcohol/week: 6.0 standard drinks    Types: 3 Cans of beer, 3 Shots of liquor per week   Drug use: No     Allergies   Patient has no known allergies.   Review of Systems Review of Systems Defer to HPI    Physical Exam Triage Vital Signs ED Triage Vitals  Enc Vitals Group     BP 05/05/21 1818 109/67     Pulse Rate 05/05/21 1714 95     Resp 05/05/21 1818 20     Temp 05/05/21 1818 98.6 F (37 C)     Temp Source 05/05/21 1818 Oral     SpO2 05/05/21 1714 98 %     Weight --      Height --      Head Circumference --      Peak Flow --      Pain Score 05/05/21 1814 0     Pain Loc --  Pain Edu? --      Excl. in GC? --    No data found.  Updated Vital Signs BP 109/67 (BP Location: Left Arm)   Pulse 80   Temp 98.6 F (37 C) (Oral)   Resp 20   LMP 04/22/2021   SpO2 100%   Visual Acuity Right Eye Distance:   Left Eye Distance:   Bilateral Distance:    Right Eye Near:   Left Eye Near:    Bilateral Near:     Physical Exam Constitutional:      Appearance: She is well-developed and normal weight.  HENT:     Head: Normocephalic.  Cardiovascular:     Rate and Rhythm: Normal rate and regular rhythm.     Pulses: Normal pulses.     Heart sounds: Normal heart sounds.  Pulmonary:     Effort: Pulmonary effort is normal.     Breath sounds: Normal breath sounds.  Skin:    General: Skin is warm and dry.  Neurological:     General: No focal deficit present.     Mental Status: She is alert and oriented to person,  place, and time.  Psychiatric:        Mood and Affect: Mood normal.        Behavior: Behavior normal.     UC Treatments / Results  Labs (all labs ordered are listed, but only abnormal results are displayed) Labs Reviewed - No data to display  EKG   Radiology No results found.  Procedures Procedures (including critical care time)  Medications Ordered in UC Medications - No data to display  Initial Impression / Assessment and Plan / UC Course  I have reviewed the triage vital signs and the nursing notes.  Pertinent labs & imaging results that were available during my care of the patient were reviewed by me and considered in my medical decision making (see chart for details).  Shortness of breath Cough  Chest x-ray negative Albuterol inhaler 90 base 2 puffs every 4 hours prn Robitussin DM 100-10 5 mL every 4 hours prn Tessalon 100 mg tid prn Advised taking medication 30-1 hr prior to eating to see if that gives relief of symptoms  Final Clinical Impressions(s) / UC Diagnoses   Final diagnoses:  None   Discharge Instructions   None    ED Prescriptions   None    PDMP not reviewed this encounter.   Valinda Hoar, NP 05/05/21 1903    Valinda Hoar, NP 05/05/21 1920

## 2021-10-04 ENCOUNTER — Ambulatory Visit (HOSPITAL_COMMUNITY)
Admission: EM | Admit: 2021-10-04 | Discharge: 2021-10-04 | Disposition: A | Discharge status: Home / Self Care | Payer: Managed Care, Other (non HMO) | Attending: Urgent Care | Admitting: Urgent Care

## 2021-10-04 ENCOUNTER — Encounter (HOSPITAL_COMMUNITY): Payer: Self-pay

## 2021-10-04 ENCOUNTER — Other Ambulatory Visit: Payer: Self-pay

## 2021-10-04 DIAGNOSIS — N390 Urinary tract infection, site not specified: Secondary | ICD-10-CM | POA: Insufficient documentation

## 2021-10-04 LAB — POCT URINALYSIS DIPSTICK, ED / UC
Bilirubin Urine: NEGATIVE
Glucose, UA: NEGATIVE mg/dL
Nitrite: NEGATIVE
Protein, ur: NEGATIVE mg/dL
Specific Gravity, Urine: >=1.03 (ref 1.005–1.030)
Urobilinogen, UA: 0.2 mg/dL (ref 0.0–1.0)
pH: 5.5 (ref 5.0–8.0)

## 2021-10-04 LAB — POC URINE PREG, ED: Preg Test, Ur: NEGATIVE

## 2021-10-04 MED ORDER — CEPHALEXIN 500 MG PO CAPS: 500.0000 mg | ORAL_CAPSULE | Freq: Four times a day (QID) | ORAL | 0 refills | Status: DC

## 2021-10-04 MED ORDER — PHENAZOPYRIDINE HCL 200 MG PO TABS: 200.0000 mg | ORAL_TABLET | Freq: Three times a day (TID) | ORAL | 0 refills | Status: DC

## 2021-10-04 NOTE — ED Triage Notes (Signed)
Pt reports feeling shivering, increased urinary frequency and lower abdominal soreness x 2 days.

## 2021-10-04 NOTE — ED Provider Notes (Signed)
[facility]  ____________________________________________  Time seen: Approximately 6:29 PM  I have reviewed the triage vital signs and the nursing notes.   HISTORY  Chief Complaint Abdominal Pain and Urinary Frequency    HPI Stephanie Floyd is a 25 y.o. female who presents to the urgent care for complaint of dysuria, suprapubic pain.  Symptoms have been going on for 2 to 3 days.  She has had history of UTIs in the past and this feels similar.  No fevers or chills, no flank pain, no hematuria.  No vaginal bleeding or discharge.  Patient has been using Tylenol for symptom relief.       Past Medical History:  Diagnosis Date   Asthma    Reflux gastritis    Urine incontinence    UTI (urinary tract infection)     Patient Active Problem List   Diagnosis Date Noted   Current moderate episode of major depressive disorder without prior episode ([place]) 10/16/2020   Chronic idiopathic constipation 10/13/2020   Abnormal weight gain 10/13/2020   Encounter for general adult medical examination with abnormal findings 10/13/2020   Cervical cancer screening 10/13/2020   Thrombocytosis 10/13/2020   Need for hepatitis C screening test 10/13/2020   Need for prophylactic vaccination and inoculation against influenza 10/13/2020    History reviewed. No pertinent surgical history.  Prior to Admission medications   Medication Sig Start Date End Date Taking? Authorizing Provider  cephALEXin (KEFLEX) 500 MG capsule Take 1 capsule (500 mg total) by mouth 4 (four) times daily. 10/04/21  Yes Noris Kulinski, Charline Bills, PA-C  phenazopyridine (PYRIDIUM) 200 MG tablet Take 1 tablet (200 mg total) by mouth 3 (three) times daily. 10/04/21  Yes Tara Wich, Charline Bills, PA-C  albuterol (VENTOLIN HFA) 108 (90 Base) MCG/ACT inhaler Inhale 2 puffs into the lungs every 4 (four) hours as needed for wheezing or shortness of breath. 05/05/21   White, Leitha Schuller, NP  benzonatate (TESSALON) 100 MG capsule  Take 1 capsule (100 mg total) by mouth every 8 (eight) hours. 05/05/21   White, Leitha Schuller, NP  DULoxetine (CYMBALTA) 30 MG capsule Take 1 capsule (30 mg total) by mouth daily. Patient not taking: Reported on 05/05/2021 10/16/20   Janith Lima, MD  guaiFENesin-dextromethorphan [facility] DM) 100-10 MG/5ML syrup Take 5 mLs by mouth every 4 (four) hours as needed for cough. 05/05/21   Hans Eden, NP    Allergies Patient has no known allergies.  Family History  Problem Relation Age of Onset   Depression Mother    Mental illness Mother    Hyperlipidemia Father    Asthma Brother    Cancer Maternal Grandmother    Cancer Paternal Grandmother     Social History Social History   Tobacco Use   Smoking status: Some Days    Types: E-cigarettes   Smokeless tobacco: Never  Vaping Use   Vaping Use: Some days  Substance Use Topics   Alcohol use: Not Currently    Alcohol/week: 6.0 standard drinks    Types: 3 Cans of beer, 3 Shots of liquor per week   Drug use: No     Review of Systems  Constitutional: No fever/chills Eyes: No visual changes. No discharge ENT: No upper respiratory complaints. Cardiovascular: no chest pain. Respiratory: no cough. No SOB. Gastrointestinal: No abdominal pain.  No nausea, no vomiting.  No diarrhea.  No constipation. Genitourinary: Positive for dysuria. No hematuria Musculoskeletal: Negative for musculoskeletal pain. Skin: Negative for rash, abrasions, lacerations, ecchymosis. Neurological:  Negative for headaches, focal weakness or numbness.  10 System ROS otherwise negative.  ____________________________________________   PHYSICAL EXAM:  VITAL SIGNS: ED Triage Vitals  Enc Vitals Group     BP 10/04/21 1741 113/65     Pulse Rate 10/04/21 1741 87     Resp 10/04/21 1741 16     Temp 10/04/21 1741 99 F (37.2 C)     Temp Source 10/04/21 1741 Oral     SpO2 10/04/21 1741 98 %     Weight --      Height --      Head Circumference --      Peak  Flow --      Pain Score 10/04/21 1740 8     Pain Loc --      Pain Edu? --      Excl. in GC? --      Constitutional: Alert and oriented. Well appearing and in no acute distress. Eyes: Conjunctivae are normal. PERRL. EOMI. Head: Atraumatic. ENT:      Ears:       Nose: No congestion/rhinnorhea.      Mouth/Throat: Mucous membranes are moist.  Neck: No stridor.    Cardiovascular: Normal rate, regular rhythm. Normal S1 and S2.  Good peripheral circulation. Respiratory: Normal respiratory effort without tachypnea or retractions. Lungs CTAB. Good air entry to the bases with no decreased or absent breath sounds. Gastrointestinal: Bowel sounds 4 quadrants.  Soft to palpation.  Mild suprapubic tenderness.. No guarding or rigidity. No palpable masses. No distention. No CVA tenderness. Musculoskeletal: Full range of motion to all extremities. No gross deformities appreciated. Neurologic:  Normal speech and language. No gross focal neurologic deficits are appreciated.  Skin:  Skin is warm, dry and intact. No rash noted. Psychiatric: Mood and affect are normal. Speech and behavior are normal. Patient exhibits appropriate insight and judgement.   ____________________________________________   LABS (all labs ordered are listed, but only abnormal results are displayed)  Labs Reviewed  POCT URINALYSIS DIPSTICK, ED / UC - Abnormal; Notable for the following components:      Result Value   Ketones, ur TRACE (*)    Hgb urine dipstick TRACE (*)    Leukocytes,Ua TRACE (*)    All other components within normal limits  URINE CULTURE  POC URINE PREG, ED   ____________________________________________  EKG   ____________________________________________  RADIOLOGY   No results found.  ____________________________________________    PROCEDURES  Procedure(s) performed:    Procedures    Medications - No data to display   ____________________________________________   INITIAL  IMPRESSION / ASSESSMENT AND PLAN / ED COURSE  Pertinent labs & imaging results that were available during my care of the patient were reviewed by me and considered in my medical decision making (see chart for details).  Review of the [place] CSRS was performed in accordance of the [facility] prior to dispensing any controlled drugs.           Patient's diagnosis is consistent with UTI.  Patient presents with dysuria.  White blood cells identified.  She has had history of UTIs in the past with similar symptoms.  Will cover with antibiotics at this time.  Patient denies any vaginal bleeding or discharge.  Discussed STD testing with the patient and she declines at this time.  States she will follow-up with her primary care for any concerns of STDs.  No CVA tenderness concerning for pyelonephritis.  Patient will be started on antibiotics.  Return precautions discussed with the  patient.  Signs and symptoms to be seen in the emergency department are discussed with the patient.  Otherwise follow-up with primary care..  Patient is given ED precautions to return to the ED for any worsening or new symptoms.     ____________________________________________  FINAL CLINICAL IMPRESSION(S) / DIAGNOSES  Final diagnoses:  Lower urinary tract infectious disease      NEW MEDICATIONS STARTED DURING THIS VISIT:  ED Discharge Orders          Ordered    cephALEXin (KEFLEX) 500 MG capsule  4 times daily        10/04/21 1900    phenazopyridine (PYRIDIUM) 200 MG tablet  3 times daily        10/04/21 1900                This chart was dictated using voice recognition software/Dragon. Despite best efforts to proofread, errors can occur which can change the meaning. Any change was purely unintentional.    Darletta Moll, PA-C 10/04/21 1901

## 2021-10-07 LAB — URINE CULTURE: Culture: >=100000 — AB

## 2021-10-14 ENCOUNTER — Other Ambulatory Visit: Payer: Self-pay

## 2021-10-14 ENCOUNTER — Encounter (HOSPITAL_COMMUNITY): Payer: Self-pay

## 2021-10-14 ENCOUNTER — Ambulatory Visit (HOSPITAL_COMMUNITY)
Admission: EM | Admit: 2021-10-14 | Discharge: 2021-10-14 | Disposition: A | Discharge status: Home / Self Care | Payer: Managed Care, Other (non HMO) | Attending: Emergency Medicine | Admitting: Emergency Medicine

## 2021-10-14 DIAGNOSIS — J020 Streptococcal pharyngitis: Secondary | ICD-10-CM

## 2021-10-14 LAB — POCT RAPID STREP A, ED / UC: Streptococcus, Group A Screen (Direct): POSITIVE — AB

## 2021-10-14 MED ORDER — IBUPROFEN 800 MG PO TABS: 800.0000 mg | ORAL_TABLET | Freq: Three times a day (TID) | ORAL | 0 refills | Status: DC

## 2021-10-14 MED ORDER — LIDOCAINE VISCOUS HCL 2 % MT SOLN: 15.0000 mL | OROMUCOSAL | 0 refills | Status: DC | PRN

## 2021-10-14 MED ORDER — AMOXICILLIN 500 MG PO CAPS: 500.0000 mg | ORAL_CAPSULE | Freq: Two times a day (BID) | ORAL | 0 refills | Status: AC

## 2021-10-14 NOTE — ED Triage Notes (Signed)
Pt presents with c/o sore throat, bilateral ear pain and c/o painful swallowing.

## 2021-10-14 NOTE — ED Provider Notes (Signed)
[facility]    CSN: [id number] Arrival date & time: 10/14/21  7026      History   Chief Complaint Chief Complaint  Patient presents with   Sore Throat   Ear Pain   Fever    HPI Stephanie Floyd is a 25 y.o. female.   Patient for sore throat, bilateral ear pain and fever for 3 days. Painful to swallow. Decreased appetite and fluid intake.  Denies cough, shortness of breath, nasal congestion, rhinorrhea, wheezing, abdominal pain, nausea, vomiting, diarrhea, headaches.  No known sick contacts.  History of asthma GERD.  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 No pertinent surgical history.  OB History   No obstetric history on file.      Home Medications    Prior to Admission medications   Medication Sig Start Date End Date Taking? Authorizing Provider  albuterol (VENTOLIN HFA) 108 (90 Base) MCG/ACT inhaler Inhale 2 puffs into the lungs every 4 (four) hours as needed for wheezing or shortness of breath. 05/05/21   Kerrigan Gombos, Elita Boone, NP  benzonatate (TESSALON) 100 MG capsule Take 1 capsule (100 mg total) by mouth every 8 (eight) hours. 05/05/21   Sundai Probert, Elita Boone, NP  cephALEXin (KEFLEX) 500 MG capsule Take 1 capsule (500 mg total) by mouth 4 (four) times daily. 10/04/21   Cuthriell, Delorise Royals, PA-C  DULoxetine (CYMBALTA) 30 MG capsule Take 1  capsule (30 mg total) by mouth daily. Patient not taking: Reported on 05/05/2021 10/16/20   Etta Grandchild, MD  guaiFENesin-dextromethorphan [facility] DM) 100-10 MG/5ML syrup Take 5 mLs by mouth every 4 (four) hours as needed for cough. 05/05/21   Valinda Hoar, NP  phenazopyridine (PYRIDIUM) 200 MG tablet Take 1 tablet (200 mg total) by mouth 3 (three) times daily. 10/04/21   Cuthriell, Delorise Royals, PA-C    Family History Family History  Problem Relation Age of Onset   Depression Mother    Mental illness Mother    Hyperlipidemia Father    Asthma Brother    Cancer Maternal Grandmother    Cancer Paternal Grandmother     Social History Social History   Tobacco Use   Smoking status: Some Days    Types: E-cigarettes   Smokeless tobacco: Never  Vaping Use   Vaping Use: Some days  Substance Use Topics   Alcohol use: Not Currently    Alcohol/week: 6.0 standard drinks    Types: 3 Cans of beer, 3 Shots of liquor per week   Drug use: No     Allergies   Patient has no known allergies.   Review of Systems Review of Systems  Constitutional:  Positive for chills and fever. Negative for activity change, appetite change, diaphoresis, fatigue and unexpected weight change.  HENT:  Positive for ear pain and sore throat. Negative for congestion, dental problem, drooling, ear discharge, facial swelling, hearing loss, mouth  sores, nosebleeds, postnasal drip, rhinorrhea, sinus pressure, sinus pain, sneezing, tinnitus, trouble swallowing and voice change.   Respiratory: Negative.    Cardiovascular: Negative.   Gastrointestinal: Negative.   Neurological: Negative.     Physical Exam Triage Vital Signs ED Triage Vitals  Enc Vitals Group     BP 10/14/21 1102 124/83     Pulse Rate 10/14/21 1102 (!) 120     Resp 10/14/21 1102 17     Temp 10/14/21 1102 99.2 F (37.3 C)     Temp Source 10/14/21 1102 Oral     SpO2 10/14/21 1102 98 %     Weight --      Height --      Head Circumference --       Peak Flow --      Pain Score 10/14/21 1100 6     Pain Loc --      Pain Edu? --      Excl. in [place]? --    No data found.  Updated Vital Signs BP 124/83 (BP Location: Left Arm)    Pulse (!) 120    Temp 99.2 F (37.3 C) (Oral)    Resp 17    LMP  (LMP Unknown)    SpO2 98%   Visual Acuity Right Eye Distance:   Left Eye Distance:   Bilateral Distance:    Right Eye Near:   Left Eye Near:    Bilateral Near:     Physical Exam Constitutional:      Appearance: She is well-developed and normal weight.  HENT:     Head: Normocephalic.     Right Ear: There is impacted cerumen.     Left Ear: Tympanic membrane, ear canal and external ear normal.     Nose: Nose normal.     Mouth/Throat:     Mouth: Mucous membranes are moist.     Pharynx: Posterior oropharyngeal erythema present.     Tonsils: No tonsillar exudate. 2+ on the right. 2+ on the left.  Cardiovascular:     Rate and Rhythm: Tachycardia present.     Pulses: Normal pulses.     Heart sounds: Normal heart sounds.  Pulmonary:     Effort: Pulmonary effort is normal.     Breath sounds: Normal breath sounds.  Musculoskeletal:        General: Normal range of motion.     Cervical back: Normal range of motion.  Lymphadenopathy:     Cervical: Cervical adenopathy present.  Skin:    General: Skin is warm and dry.  Neurological:     Mental Status: She is alert and oriented to person, place, and time. Mental status is at baseline.  Psychiatric:        Mood and Affect: Mood normal.        Behavior: Behavior normal.     UC Treatments / Results  Labs (all labs ordered are listed, but only abnormal results are displayed) Labs Reviewed  POCT RAPID STREP A, ED / UC    EKG   Radiology No results found.  Procedures Procedures (including critical care time)  Medications Ordered in UC Medications - No data to display  Initial Impression / Assessment and Plan / UC Course  I have reviewed the triage vital signs and the  nursing notes.  Pertinent labs & imaging results that were available during my care of the patient were reviewed by me and considered in my medical decision making (see chart for details).  Strep pharyngitis  Confirmed by PCR  1.  Amoxicillin 500 mg twice daily for 10 days 2.  Lidocaine viscous 2% 15 mils every 4 hours 3.  Ibuprofen 800 mg 3 times daily as needed 4.  Salt water gargles, Listerine, throat lozenges and teas for additional comfort 5.  Care follow-up as needed Final Clinical Impressions(s) / UC Diagnoses   Final diagnoses:  None   Discharge Instructions   None    ED Prescriptions   None    PDMP not reviewed this encounter.   Valinda Hoar, NP 10/14/21 1235

## 2021-10-14 NOTE — Discharge Instructions (Signed)
Your rapid strep test today was positive  Take amoxicillin twice a day for 10 days   You may gargle and spit lidocaine solution every 4 hours as needed for temporary relief of your sore throat  May use ibuprofen every 8 hours as needed in addition to Tylenol for additional comfort  You may follow-up at urgent care as needed

## 2022-07-17 ENCOUNTER — Ambulatory Visit
Admission: EM | Admit: 2022-07-17 | Discharge: 2022-07-17 | Disposition: A | Discharge status: Home / Self Care | Payer: Self-pay | Attending: Internal Medicine | Admitting: Internal Medicine

## 2022-07-17 DIAGNOSIS — R350 Frequency of micturition: Secondary | ICD-10-CM | POA: Insufficient documentation

## 2022-07-17 DIAGNOSIS — R300 Dysuria: Secondary | ICD-10-CM | POA: Insufficient documentation

## 2022-07-17 DIAGNOSIS — J069 Acute upper respiratory infection, unspecified: Secondary | ICD-10-CM | POA: Insufficient documentation

## 2022-07-17 LAB — POCT URINALYSIS DIP (MANUAL ENTRY)
Bilirubin, UA: NEGATIVE
Blood, UA: NEGATIVE
Glucose, UA: NEGATIVE mg/dL
Ketones, POC UA: NEGATIVE mg/dL
Leukocytes, UA: NEGATIVE
Nitrite, UA: NEGATIVE
Protein Ur, POC: NEGATIVE mg/dL
Spec Grav, UA: 1.025 (ref 1.010–1.025)
Urobilinogen, UA: 0.2 E.U./dL
pH, UA: 6.5 (ref 5.0–8.0)

## 2022-07-17 MED ORDER — CETIRIZINE HCL 10 MG PO TABS: 10.0000 mg | ORAL_TABLET | Freq: Every day | ORAL | 0 refills | Status: DC

## 2022-07-17 MED ORDER — FLUTICASONE PROPIONATE 50 MCG/ACT NA SUSP: 1.0000 | Freq: Every day | NASAL | 0 refills | Status: DC

## 2022-07-17 MED ORDER — BENZONATATE 100 MG PO CAPS: 100.0000 mg | ORAL_CAPSULE | Freq: Three times a day (TID) | ORAL | 0 refills | Status: DC | PRN

## 2022-07-17 NOTE — Discharge Instructions (Signed)
It appears that your upper respiratory symptoms and cough are related to a viral upper respiratory infection that should run its course and self resolve with symptomatic treatment.  I have sent you 3 medications.  I am not able to prescribe the cough medication that we discussed that causes drowsiness given that it may interact with other medications.  Urine did not show signs of urinary tract infection.  Your vaginal swab is pending.  We will call if it is abnormal.  Please follow-up if any symptoms persist or worsen.

## 2022-07-17 NOTE — ED Provider Notes (Signed)
[facility]    CSN: [id number] Arrival date & time: 07/17/22  1725      History   Chief Complaint Chief Complaint  Patient presents with   Fatigue    HPI Stephanie Floyd is a 26 y.o. female.   Patient presents with several different chief complaints.  Patient reports that she has been having nasal congestion cough, chills, body aches including lower back pain that started about a week prior.  Patient reports that several of her family members have similar symptoms.  Denies any known fevers at home.  Patient has taken over-the-counter cough and cold medication with minimal improvement in symptoms.  Patient denies that she has a history of asthma.  Denies chest pain, shortness of breath, sore throat, ear pain, nausea, vomiting, diarrhea, abdominal pain.  Patient also reporting that she has been having some urinary burning and frequency.  Patient reports that urinary burning only occurs after she urinates.  Denies vaginal discharge, abnormal vaginal bleeding, abdominal pain, fever.  Denies any confirmed exposure to STD or any recent unprotected sexual intercourse.  She reports that took 2 doses of leftover clindamycin today.     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 No pertinent surgical history.  OB History   No obstetric history on file.       Home Medications    Prior to Admission medications   Medication Sig Start Date End Date Taking? Authorizing Provider  benzonatate (TESSALON) 100 MG capsule Take 1 capsule (100 mg total) by mouth every 8 (eight) hours as needed for cough. 07/17/22  Yes Zadaya Cuadra, Acie Fredrickson, FNP  cetirizine (ZYRTEC) 10 MG tablet Take 1 tablet (10 mg total) by mouth daily. 07/17/22  Yes Audri Kozub, Acie Fredrickson, FNP  fluticasone (FLONASE) 50 MCG/ACT nasal spray Place 1 spray into both nostrils daily for 3 days. 07/17/22 07/20/22 Yes Josef Tourigny, Acie Fredrickson, FNP  albuterol (VENTOLIN HFA) 108 (90 Base) MCG/ACT inhaler Inhale 2 puffs into the lungs every 4 (four) hours as needed for wheezing or shortness of breath. 05/05/21   White, Elita Boone, NP  DULoxetine (CYMBALTA) 30 MG capsule Take 1 capsule (30 mg total) by mouth daily. Patient not taking: Reported on 05/05/2021 10/16/20   Etta Grandchild, MD  ibuprofen (ADVIL) 800 MG tablet Take 1 tablet (800 mg total) by mouth 3 (three) times daily. 10/14/21   White, Elita Boone, NP  lidocaine (XYLOCAINE) 2 % solution Use as directed 15 mLs in the mouth or throat as needed for mouth pain. 10/14/21   Valinda Hoar, NP  phenazopyridine (PYRIDIUM) 200 MG tablet Take 1 tablet (200 mg total) by mouth 3 (three) times daily. 10/04/21   Cuthriell, Delorise Royals, PA-C    Family History Family History  Problem Relation Age of Onset   Depression Mother    Mental illness Mother    Hyperlipidemia Father    Asthma  Brother    Cancer Maternal Grandmother    Cancer Paternal Grandmother     Social History Social History   Tobacco Use   Smoking status: Some Days    Types: E-cigarettes   Smokeless tobacco: Never  Vaping Use   Vaping Use: Some days  Substance Use Topics   Alcohol use: Not Currently    Alcohol/week: 6.0 standard drinks of alcohol    Types: 3 Cans of beer, 3 Shots of liquor per week   Drug use: No     Allergies   Patient has no known allergies.   Review of Systems Review of  Systems Per HPI  Physical Exam Triage Vital Signs ED Triage Vitals  Enc Vitals Group     BP 07/17/22 1852 97/69     Pulse Rate 07/17/22 1852 100     Resp 07/17/22 1852 18     Temp 07/17/22 1852 98.2 F (36.8 C)     Temp Source 07/17/22 1852 Oral     SpO2 07/17/22 1852 98 %     Weight --      Height --      Head Circumference --      Peak Flow --      Pain Score 07/17/22 1853 6     Pain Loc --      Pain Edu? --      Excl. in [place]? --    No data found.  Updated Vital Signs BP 97/69 (BP Location: Left Arm)   Pulse 100   Temp 98.2 F (36.8 C) (Oral)   Resp 18   SpO2 98%   Visual Acuity Right Eye Distance:   Left Eye Distance:   Bilateral Distance:    Right Eye Near:   Left Eye Near:    Bilateral Near:     Physical Exam Constitutional:      General: She is not in acute distress.    Appearance: Normal appearance. She is not toxic-appearing or diaphoretic.  HENT:     Head: Normocephalic and atraumatic.     Right Ear: Ear canal normal. A middle ear effusion is present. Tympanic membrane is not perforated, erythematous or bulging.     Left Ear: Ear canal normal. A middle ear effusion is present. Tympanic membrane is not perforated, erythematous or bulging.     Nose: Congestion present.     Mouth/Throat:     Mouth: Mucous membranes are moist.     Pharynx: No posterior oropharyngeal erythema.  Eyes:     Extraocular Movements: Extraocular movements intact.     Conjunctiva/sclera: Conjunctivae normal.     Pupils: Pupils are equal, round, and reactive to light.  Cardiovascular:     Rate and Rhythm: Normal rate and regular rhythm.     Pulses: Normal pulses.     Heart sounds: Normal heart sounds.  Pulmonary:     Effort: Pulmonary effort is normal. No respiratory distress.     Breath sounds: Normal breath sounds. No stridor. No wheezing, rhonchi or rales.  Abdominal:     General: Abdomen is flat. Bowel sounds are normal.     Palpations: Abdomen is soft.   Genitourinary:    Comments: Deferred with shared decision-making.  Self swab performed. Musculoskeletal:        General: Normal range of motion.     Cervical back: Normal range of motion.  Skin:    General: Skin is warm and dry.  Neurological:     General: No focal deficit present.  Mental Status: She is alert and oriented to person, place, and time. Mental status is at baseline.  Psychiatric:        Mood and Affect: Mood normal.        Behavior: Behavior normal.      UC Treatments / Results  Labs (all labs ordered are listed, but only abnormal results are displayed) Labs Reviewed  POCT URINALYSIS DIP (MANUAL ENTRY)  POCT URINE PREGNANCY  CERVICOVAGINAL ANCILLARY ONLY    EKG   Radiology No results found.  Procedures Procedures (including critical care time)  Medications Ordered in UC Medications - No data to display  Initial Impression / Assessment and Plan / UC Course  I have reviewed the triage vital signs and the nursing notes.  Pertinent labs & imaging results that were available during my care of the patient were reviewed by me and considered in my medical decision making (see chart for details).     Patient presents with symptoms likely from a viral upper respiratory infection. Differential includes bacterial pneumonia, sinusitis, allergic rhinitis, COVID-19, flu. Do not suspect underlying cardiopulmonary process. Symptoms seem unlikely related to ACS, CHF or COPD exacerbations, pneumonia, pneumothorax. Patient is nontoxic appearing and not in need of emergent medical intervention.  Do not think viral testing is necessary given duration of symptoms and would not change treatment. Recommended symptom control with over the counter medications.  Patient sent prescriptions.  Urinalysis was completely clear.  Do not think urine culture is necessary given UA was clear.  Will send cervicovaginal swab to rule out vaginitis as cause of patient's symptoms.  Patient  advised to follow-up if any symptoms persist or worsen.  Return if symptoms fail to improve in 1-2 weeks or you develop shortness of breath, chest pain, severe headache. Patient states understanding and is agreeable.  Discharged with PCP followup.  Final Clinical Impressions(s) / UC Diagnoses   Final diagnoses:  Viral upper respiratory tract infection with cough  Dysuria  Urinary frequency     Discharge Instructions      It appears that your upper respiratory symptoms and cough are related to a viral upper respiratory infection that should run its course and self resolve with symptomatic treatment.  I have sent you 3 medications.  I am not able to prescribe the cough medication that we discussed that causes drowsiness given that it may interact with other medications.  Urine did not show signs of urinary tract infection.  Your vaginal swab is pending.  We will call if it is abnormal.  Please follow-up if any symptoms persist or worsen.    ED Prescriptions     Medication Sig Dispense Auth. Provider   fluticasone (FLONASE) 50 MCG/ACT nasal spray Place 1 spray into both nostrils daily for 3 days. 16 g Bruce Mayers, Hildred Alamin E, [place]   benzonatate (TESSALON) 100 MG capsule Take 1 capsule (100 mg total) by mouth every 8 (eight) hours as needed for cough. 21 capsule [place], [place] E, [place]   cetirizine (ZYRTEC) 10 MG tablet Take 1 tablet (10 mg total) by mouth daily. 30 tablet [place], Michele Rockers, [place]      PDMP not reviewed this encounter.   Teodora Medici, [place] 07/17/22 1947

## 2022-07-17 NOTE — ED Triage Notes (Signed)
Pt c/o body chills, back pain, malaise, fatigue, frequency, dysuria, onset ~ 1.5 weeks ago. Tried leftover clindamycin.

## 2022-07-18 ENCOUNTER — Ambulatory Visit (HOSPITAL_COMMUNITY): Payer: Managed Care, Other (non HMO)

## 2022-07-19 LAB — CERVICOVAGINAL ANCILLARY ONLY
Bacterial Vaginitis (gardnerella): NEGATIVE
Candida Glabrata: NEGATIVE
Candida Vaginitis: POSITIVE — AB
Chlamydia: NEGATIVE
Comment: NEGATIVE
Comment: NEGATIVE
Comment: NEGATIVE
Comment: NEGATIVE
Comment: NEGATIVE
Comment: NORMAL
Neisseria Gonorrhea: NEGATIVE
Trichomonas: NEGATIVE

## 2022-07-20 ENCOUNTER — Telehealth (HOSPITAL_COMMUNITY): Payer: Self-pay | Admitting: Emergency Medicine

## 2022-07-20 MED ORDER — FLUCONAZOLE 150 MG PO TABS: 150.0000 mg | ORAL_TABLET | Freq: Once | ORAL | 0 refills | Status: AC

## 2023-03-10 IMAGING — DX DG CHEST 2V
2 series · 2 of 2 positions shown · non-contrast
Comparison: 09/27/2014

CLINICAL DATA: Shortness of breath, post COVID

EXAM:
CHEST - 2 VIEW

[chest pa]
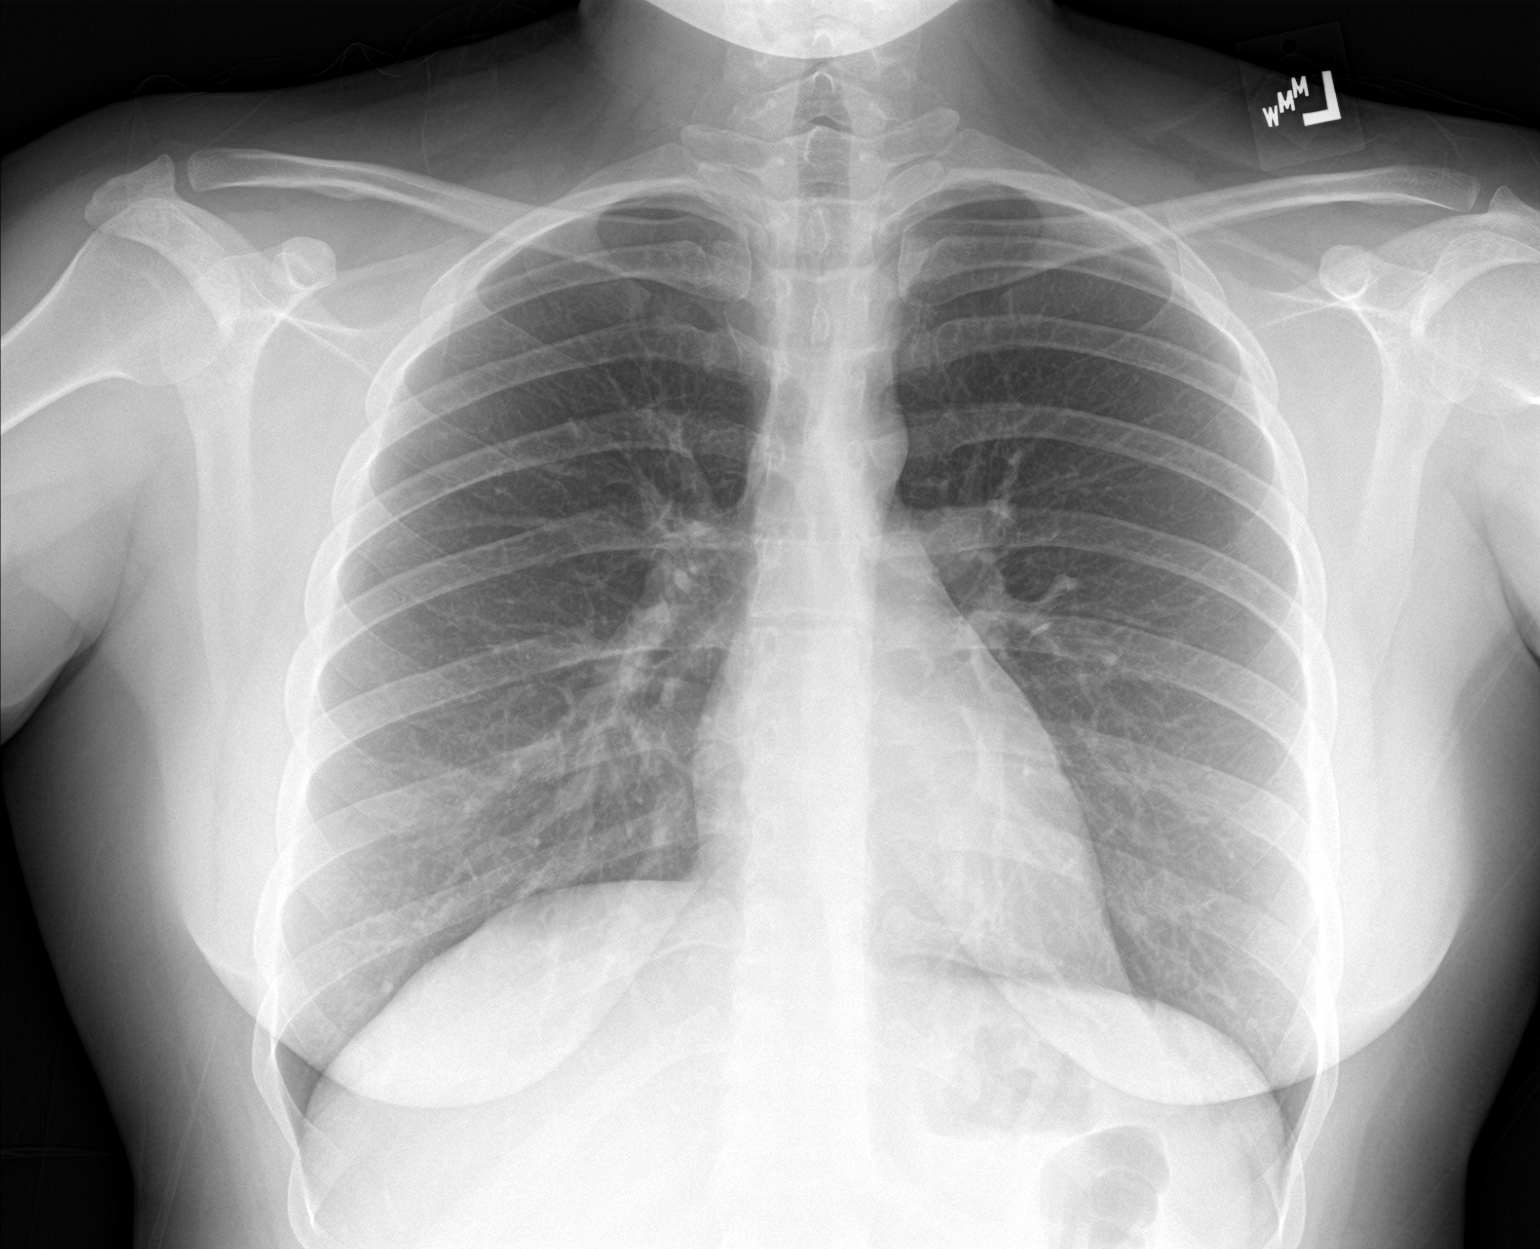

[chest lat]
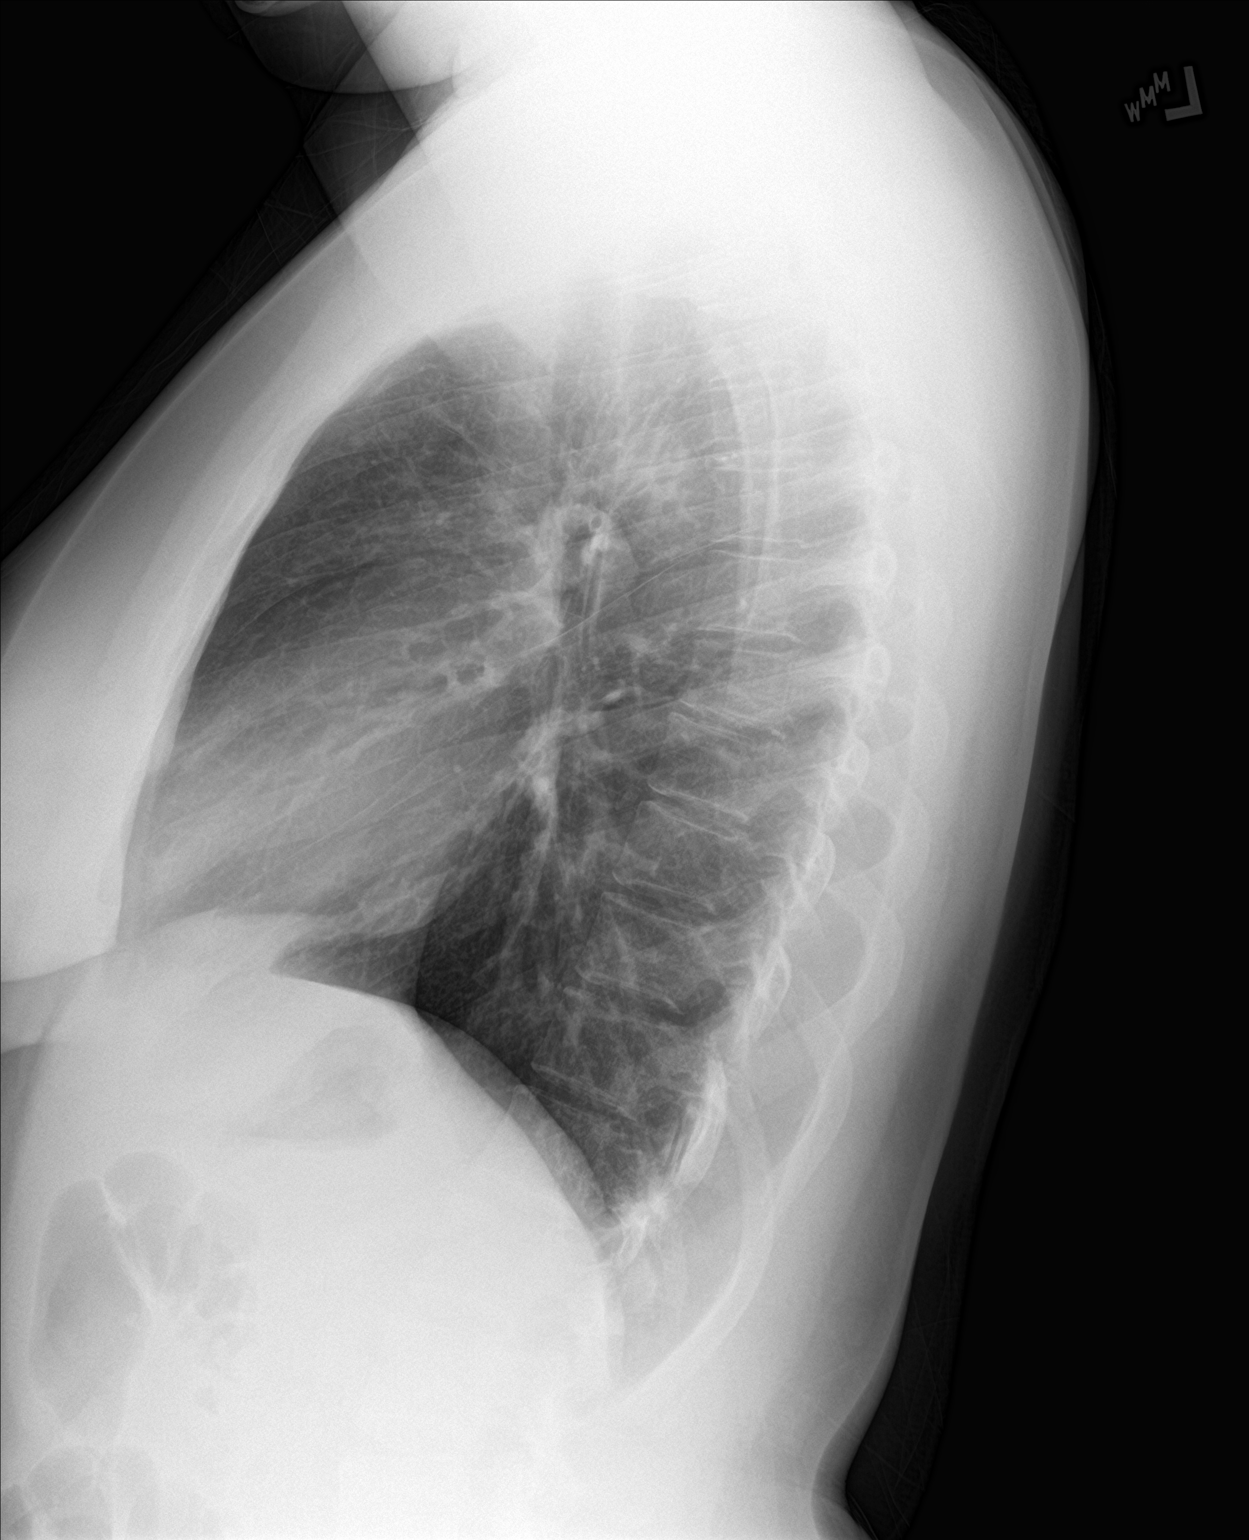

[2 of 2 positions shown; findings below may reference images not displayed]

FINDINGS: Lungs are clear.  No pleural effusion or pneumothorax.

The heart is normal in size.

Visualized osseous structures are within normal limits.
IMPRESSION: Normal chest radiographs.

## 2023-07-06 ENCOUNTER — Ambulatory Visit
Admission: EM | Admit: 2023-07-06 | Discharge: 2023-07-06 | Disposition: A | Discharge status: Home / Self Care | Payer: Medicaid Other | Attending: Physician Assistant | Admitting: Physician Assistant

## 2023-07-06 DIAGNOSIS — N898 Other specified noninflammatory disorders of vagina: Secondary | ICD-10-CM | POA: Insufficient documentation

## 2023-07-06 DIAGNOSIS — R8281 Pyuria: Secondary | ICD-10-CM | POA: Insufficient documentation

## 2023-07-06 LAB — POCT URINALYSIS DIP (MANUAL ENTRY)
Bilirubin, UA: NEGATIVE
Blood, UA: NEGATIVE
Glucose, UA: NEGATIVE mg/dL
Ketones, POC UA: NEGATIVE mg/dL
Nitrite, UA: NEGATIVE
Protein Ur, POC: NEGATIVE mg/dL
Spec Grav, UA: 1.01 (ref 1.010–1.025)
Urobilinogen, UA: 0.2 U/dL
pH, UA: 6.5 (ref 5.0–8.0)

## 2023-07-06 LAB — POCT URINE PREGNANCY: Preg Test, Ur: NEGATIVE

## 2023-07-06 MED ORDER — FLUCONAZOLE 150 MG PO TABS
ORAL_TABLET | ORAL | 0 refills | Status: AC
Start: 2023-07-06 — End: ?

## 2023-07-06 NOTE — ED Triage Notes (Signed)
Sx 1 week. Vaginal itching,watery clumpy discharge.

## 2023-07-06 NOTE — ED Provider Notes (Signed)
[facility]    CSN: [id number] Arrival date & time: 07/06/23  1029      History   Chief Complaint Chief Complaint  Patient presents with   Vaginal Discharge    HPI Stephanie Floyd is a 27 y.o. female.   Patient here today for evaluation of vaginal itching and watery clumpy discharge.  She states discharge is white in color.  She notes she has had symptoms for a week.  She states symptoms started after she had went into a sauna at the gym and then wore wet clothing for an extended time while picking up her sibling.  She has not had any genital lesions or rashes.  She denies any known STD exposure.  She does not report treatment for symptoms.  She does have history of chlamydia.  The history is provided by the patient.    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 No pertinent surgical history.  OB History   No obstetric history on file.      Home Medications    Prior to Admission medications   Medication Sig Start Date End Date Taking? Authorizing Provider  fluconazole (DIFLUCAN) 150 MG tablet Take one tab PO today and repeat dose in 3 days if symptoms persist 07/06/23  Yes Tomi Bamberger, PA-C  albuterol (VENTOLIN HFA) 108 (90 Base) MCG/ACT inhaler Inhale 2 puffs into the lungs every 4 (four) hours as needed for wheezing or shortness of  breath. 05/05/21   White, Elita Boone, NP  benzonatate (TESSALON) 100 MG capsule Take 1 capsule (100 mg total) by mouth every 8 (eight) hours as needed for cough. 07/17/22   Gustavus Bryant, FNP  cetirizine (ZYRTEC) 10 MG tablet Take 1 tablet (10 mg total) by mouth daily. 07/17/22   Gustavus Bryant, FNP  DULoxetine (CYMBALTA) 30 MG capsule Take 1 capsule (30 mg total) by mouth daily. Patient not taking: Reported on 05/05/2021 10/16/20   Etta Grandchild, MD  fluticasone [facility]) 50 MCG/ACT nasal spray Place 1 spray into both nostrils daily for 3 days. 07/17/22 07/20/22  Gustavus Bryant, FNP  ibuprofen (ADVIL) 800 MG tablet Take 1 tablet (800 mg total) by mouth 3 (three) times daily. 10/14/21   White, Elita Boone, NP  lidocaine (XYLOCAINE) 2 % solution Use as directed 15 mLs in the mouth or throat as needed for mouth pain. 10/14/21   Valinda Hoar, NP  phenazopyridine (PYRIDIUM) 200 MG tablet Take 1 tablet (200 mg total) by mouth 3 (three) times daily. 10/04/21   Cuthriell, Delorise Royals, PA-C    Family History Family History  Problem Relation Age of Onset   Depression Mother    Mental illness Mother    Hyperlipidemia Father    Asthma Brother    Cancer Maternal Grandmother    Cancer Paternal Grandmother     Social History Social History  Tobacco Use   Smoking status: Some Days    Types: E-cigarettes   Smokeless tobacco: Never  Vaping Use   Vaping status: Some Days  Substance Use Topics   Alcohol use: Not Currently    Alcohol/week: 6.0 standard drinks of alcohol    Types: 3 Cans of beer, 3 Shots of liquor per week   Drug use: No     Allergies   Patient has no known allergies.   Review of Systems Review of Systems  Constitutional:  Negative for chills and fever.  Eyes:  Negative for discharge and redness.  Respiratory:  Negative for shortness of breath.   Gastrointestinal:  Negative for abdominal pain, nausea and vomiting.  Genitourinary:  Positive for vaginal discharge. Negative  for genital sores.     Physical Exam Triage Vital Signs ED Triage Vitals  Encounter Vitals Group     BP      Systolic BP Percentile      Diastolic BP Percentile      Pulse      Resp      Temp      Temp src      SpO2      Weight      Height      Head Circumference      Peak Flow      Pain Score      Pain Loc      Pain Education      Exclude from Growth Chart    No data found.  Updated Vital Signs BP 110/76   Pulse 88   Temp 98.4 F (36.9 C) (Oral)   Resp 17   Wt 150 lb (68 kg)   SpO2 96%   BMI 27.44 kg/m      Physical Exam Vitals and nursing note reviewed.  Constitutional:      General: She is not in acute distress.    Appearance: Normal appearance. She is not ill-appearing.  HENT:     Head: Normocephalic and atraumatic.  Eyes:     Conjunctiva/sclera: Conjunctivae normal.  Cardiovascular:     Rate and Rhythm: Normal rate.  Pulmonary:     Effort: Pulmonary effort is normal. No respiratory distress.  Neurological:     Mental Status: She is alert.  Psychiatric:        Mood and Affect: Mood normal.        Behavior: Behavior normal.        Thought Content: Thought content normal.      UC Treatments / Results  Labs (all labs ordered are listed, but only abnormal results are displayed) Labs Reviewed  POCT URINALYSIS DIP (MANUAL ENTRY) - Abnormal; Notable for the following components:      Result Value   Leukocytes, UA Small (1+) (*)    All other components within normal limits  URINE CULTURE  POCT URINE PREGNANCY  CERVICOVAGINAL ANCILLARY ONLY    EKG   Radiology No results found.  Procedures Procedures (including critical care time)  Medications Ordered in UC Medications - No data to display  Initial Impression / Assessment and Plan / UC Course  I have reviewed the triage vital signs and the nursing notes.  Pertinent labs & imaging results that were available during my care of the patient were reviewed by me and considered in my  medical decision making (see chart for details).    UA without signs of infection, will order urine culture given small amount of leukocyte Esterase.  Will order  cervicovaginal screening and will treat to cover yeast given reported appearance of discharge and other symptoms.  Encouraged follow-up with any further concerns.  Final Clinical Impressions(s) / UC Diagnoses   Final diagnoses:  Pyuria  Vaginal discharge   Discharge Instructions   None    ED Prescriptions     Medication Sig Dispense Auth. Provider   fluconazole (DIFLUCAN) 150 MG tablet Take one tab PO today and repeat dose in 3 days if symptoms persist 2 tablet Tomi Bamberger, PA-C      PDMP not reviewed this encounter.   Tomi Bamberger, PA-C 07/06/23 1331

## 2023-07-07 LAB — URINE CULTURE: Culture: <10000 — AB

## 2023-07-09 ENCOUNTER — Telehealth (HOSPITAL_COMMUNITY): Payer: Self-pay | Admitting: Emergency Medicine

## 2023-07-09 LAB — CERVICOVAGINAL ANCILLARY ONLY
Bacterial Vaginitis (gardnerella): POSITIVE — AB
Candida Glabrata: NEGATIVE
Candida Vaginitis: POSITIVE — AB
Chlamydia: NEGATIVE
Comment: NEGATIVE
Comment: NEGATIVE
Comment: NEGATIVE
Comment: NEGATIVE
Comment: NEGATIVE
Comment: NORMAL
Neisseria Gonorrhea: NEGATIVE
Trichomonas: NEGATIVE

## 2023-07-09 MED ORDER — METRONIDAZOLE 500 MG PO TABS: 500.0000 mg | ORAL_TABLET | Freq: Two times a day (BID) | ORAL | 0 refills | Status: DC

## 2023-07-09 NOTE — Telephone Encounter (Signed)
Metronidazole for positive BV

## 2023-07-27 ENCOUNTER — Encounter: Payer: Self-pay | Admitting: Physician Assistant

## 2023-07-27 ENCOUNTER — Telehealth: Payer: Self-pay

## 2023-07-27 ENCOUNTER — Ambulatory Visit
Admission: EM | Admit: 2023-07-27 | Discharge: 2023-07-27 | Disposition: A | Discharge status: Home / Self Care | Payer: Medicaid Other | Attending: Physician Assistant | Admitting: Physician Assistant

## 2023-07-27 ENCOUNTER — Ambulatory Visit: Admit: 2023-07-27 | Payer: Medicaid Other

## 2023-07-27 DIAGNOSIS — N760 Acute vaginitis: Secondary | ICD-10-CM | POA: Diagnosis not present

## 2023-07-27 LAB — POCT URINALYSIS DIP (MANUAL ENTRY)
Bilirubin, UA: NEGATIVE
Blood, UA: NEGATIVE
Glucose, UA: NEGATIVE mg/dL
Nitrite, UA: NEGATIVE
Protein Ur, POC: NEGATIVE mg/dL
Spec Grav, UA: 1.01 (ref 1.010–1.025)
Urobilinogen, UA: 0.2 U/dL
pH, UA: 7 (ref 5.0–8.0)

## 2023-07-27 LAB — POCT URINE PREGNANCY: Preg Test, Ur: POSITIVE — AB

## 2023-07-27 MED ORDER — FLUCONAZOLE 150 MG PO TABS: ORAL_TABLET | ORAL | 0 refills | Status: DC

## 2023-07-27 NOTE — ED Triage Notes (Signed)
"  I came in a few wks ago because I had a yeast infection and BV". "I finished the medication but I think I have a yeast infection again, I was told to call back and just be treated".

## 2023-07-27 NOTE — ED Provider Notes (Signed)
[facility]    CSN: [id number] Arrival date & time: 07/27/23  1415      History   Chief Complaint Chief Complaint  Patient presents with   Vaginitis    HPI Stephanie Floyd is a 27 y.o. female.   The history is provided by the patient.    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 No pertinent surgical history.  OB History     Gravida  1   Para      Term      Preterm      AB      Living         SAB      IAB      Ectopic      Multiple      Live Births               Home Medications    Prior to Admission medications   Medication Sig Start Date End Date Taking? Authorizing Provider  fluconazole (DIFLUCAN) 150 MG tablet Take one tab PO today and repeat dose in 3 days if symptoms persist 07/27/23  Yes Tomi Bamberger, PA-C  metroNIDAZOLE (FLAGYL) 500 MG tablet Take 1 tablet (500 mg total) by mouth 2 (two) times daily. 07/09/23  Yes Lamptey, Britta Mccreedy, MD  albuterol (VENTOLIN HFA) 108 (90 Base) MCG/ACT inhaler Inhale 2 puffs into the lungs every 4 (four) hours as needed for wheezing or shortness of breath. 05/05/21   White, Elita Boone, NP  benzonatate (TESSALON) 100 MG capsule Take 1 capsule (100 mg total) by mouth every 8 (eight) hours as needed for cough. 07/17/22   Gustavus Bryant, FNP  cetirizine (ZYRTEC) 10 MG tablet Take 1  tablet (10 mg total) by mouth daily. 07/17/22   Gustavus Bryant, FNP  DULoxetine (CYMBALTA) 30 MG capsule Take 1 capsule (30 mg total) by mouth daily. Patient not taking: Reported on 05/05/2021 10/16/20   Etta Grandchild, MD  fluticasone [facility]) 50 MCG/ACT nasal spray Place 1 spray into both nostrils daily for 3 days. 07/17/22 07/20/22  Gustavus Bryant, FNP  ibuprofen (ADVIL) 800 MG tablet Take 1 tablet (800 mg total) by mouth 3 (three) times daily. 10/14/21   White, Elita Boone, NP  lidocaine (XYLOCAINE) 2 % solution Use as directed 15 mLs in the mouth or throat as needed for mouth pain. 10/14/21   Valinda Hoar, NP  phenazopyridine (PYRIDIUM) 200 MG tablet Take 1 tablet (200 mg total) by mouth 3 (three) times daily. 10/04/21   Cuthriell, Delorise Royals, PA-C    Family History Family History  Problem Relation Age of Onset   Depression Mother    Mental illness Mother    Hyperlipidemia Father    Asthma Brother    Cancer Maternal Grandmother    Cancer Paternal Grandmother     Social History Social History  Tobacco Use   Smoking status: Some Days    Types: E-cigarettes   Smokeless tobacco: Never  Vaping Use   Vaping status: Some Days  Substance Use Topics   Alcohol use: Not Currently    Alcohol/week: 6.0 standard drinks of alcohol    Types: 3 Cans of beer, 3 Shots of liquor per week   Drug use: No     Allergies   Patient has no known allergies.   Review of Systems Review of Systems   Physical Exam Triage Vital Signs ED Triage Vitals  Encounter Vitals Group     BP 07/27/23 1419 112/72     Systolic BP Percentile --      Diastolic BP Percentile --      Pulse Rate 07/27/23 1419 (!) 104     Resp 07/27/23 1419 16     Temp 07/27/23 1419 98.5 F (36.9 C)     Temp Source 07/27/23 1419 Oral     SpO2 07/27/23 1419 99 %     Weight 07/27/23 1417 145 lb (65.8 kg)     Height 07/27/23 1417 5\' 2"  (1.575 m)     Head Circumference --      Peak Flow --      Pain Score 07/27/23 1416 0      Pain Loc --      Pain Education --      Exclude from Growth Chart --    No data found.  Updated Vital Signs BP 112/72 (BP Location: Left Arm)   Pulse (!) 104   Temp 98.5 F (36.9 C) (Oral)   Resp 16   Ht 5\' 2"  (1.575 m)   Wt 145 lb (65.8 kg)   LMP 06/21/2023 (Exact Date)   SpO2 99%   BMI 26.52 kg/m   Visual Acuity Right Eye Distance:   Left Eye Distance:   Bilateral Distance:    Right Eye Near:   Left Eye Near:    Bilateral Near:     Physical Exam   UC Treatments / Results  Labs (all labs ordered are listed, but only abnormal results are displayed) Labs Reviewed  POCT URINALYSIS DIP (MANUAL ENTRY) - Abnormal; Notable for the following components:      Result Value   Ketones, POC UA trace (5) (*)    Leukocytes, UA Small (1+) (*)    All other components within normal limits  POCT URINE PREGNANCY - Abnormal; Notable for the following components:   Preg Test, Ur Positive (*)    All other components within normal limits    EKG   Radiology No results found.  Procedures Procedures (including critical care time)  Medications Ordered in UC Medications - No data to display  Initial Impression / Assessment and Plan / UC Course  I have reviewed the triage vital signs and the nursing notes.  Pertinent labs & imaging results that were available during my care of the patient were reviewed by me and considered in my medical decision making (see chart for details).     *** Final Clinical Impressions(s) / UC Diagnoses   Final diagnoses:  Acute vaginitis   Discharge Instructions   None    ED Prescriptions     Medication Sig Dispense Auth. Provider   fluconazole (DIFLUCAN) 150 MG tablet Take one tab PO today and repeat dose in 3 days if symptoms persist 2 tablet Tomi Bamberger, PA-C      PDMP not reviewed this encounter.

## 2023-07-27 NOTE — Telephone Encounter (Signed)
Called the Pharmacy (per provider) to cancel the following:  fluconazole (DIFLUCAN) 150 MG tablet [[id number]]   Order Details Dose, Route, Frequency: As Directed  Dispense Quantity: 2 tablet Refills: 0        Sig: Take one tab PO today and repeat dose in 3 days if symptoms persist       Start Date: 07/27/23 End Date: --  Written Date: 07/27/23     Spoke with Geraldine Contras & Harriett Sine. Rx cancelled.  B. Roten CMA

## 2023-07-31 ENCOUNTER — Encounter: Payer: Self-pay | Admitting: Physician Assistant

## 2023-09-23 ENCOUNTER — Encounter (HOSPITAL_COMMUNITY): Payer: Self-pay

## 2023-09-23 ENCOUNTER — Other Ambulatory Visit: Payer: Self-pay

## 2023-09-23 ENCOUNTER — Emergency Department (HOSPITAL_COMMUNITY)
Admission: EM | Admit: 2023-09-23 | Discharge: 2023-09-23 | Disposition: A | Discharge status: Home / Self Care | Payer: Medicaid Other | Attending: Emergency Medicine | Admitting: Emergency Medicine

## 2023-09-23 DIAGNOSIS — R000 Tachycardia, unspecified: Secondary | ICD-10-CM | POA: Insufficient documentation

## 2023-09-23 DIAGNOSIS — Z20822 Contact with and (suspected) exposure to covid-19: Secondary | ICD-10-CM | POA: Diagnosis not present

## 2023-09-23 DIAGNOSIS — J069 Acute upper respiratory infection, unspecified: Secondary | ICD-10-CM | POA: Insufficient documentation

## 2023-09-23 DIAGNOSIS — R0981 Nasal congestion: Secondary | ICD-10-CM | POA: Diagnosis present

## 2023-09-23 LAB — RESP PANEL BY RT-PCR (RSV, FLU A&B, COVID)  RVPGX2
Influenza A by PCR: NEGATIVE
Influenza B by PCR: NEGATIVE
Resp Syncytial Virus by PCR: NEGATIVE
SARS Coronavirus 2 by RT PCR: NEGATIVE

## 2023-09-23 MED ORDER — DOXYCYCLINE HYCLATE 100 MG PO CAPS: 100.0000 mg | ORAL_CAPSULE | Freq: Two times a day (BID) | ORAL | 0 refills | Status: DC

## 2023-09-23 NOTE — ED Provider Notes (Signed)
[place] EMERGENCY DEPARTMENT AT [facility] Provider Note   CSN: [id number] Arrival date & time: 09/23/23  4098     History  Chief Complaint  Patient presents with   Nasal Congestion    Stephanie Floyd is a 27 y.o. female.  27 year old female presents today for concern of URI symptoms ongoing for the past several days.  She states several people at her work have been sick recently including with walking pneumonias.  Endorses decreased fluid intake.  She states she has no difficulty tolerating p.o. intake however.  She states yesterday she had a low-grade temperature of 99.6.  Has had productive cough of green sputum.  Endorses congestion and postnasal drip as well.  The history is provided by the patient. No language interpreter was used.       Home Medications Prior to Admission medications   Medication Sig Start Date End Date Taking? Authorizing Provider  doxycycline (VIBRAMYCIN) 100 MG capsule Take 1 capsule (100 mg total) by mouth 2 (two) times daily. 09/23/23  Yes Gerardine Peltz, PA-C  albuterol (VENTOLIN HFA) 108 (90 Base) MCG/ACT inhaler Inhale 2 puffs into the lungs every 4 (four) hours as needed for wheezing or shortness of breath. 05/05/21   White, Elita Boone, NP  benzonatate (TESSALON) 100 MG capsule Take 1 capsule (100 mg total) by mouth every 8 (eight) hours as needed for cough. 07/17/22   Gustavus Bryant, FNP  cetirizine (ZYRTEC) 10 MG tablet Take 1 tablet (10 mg total) by mouth daily. 07/17/22   Gustavus Bryant, FNP  DULoxetine (CYMBALTA) 30 MG capsule Take 1 capsule (30 mg total) by mouth daily. Patient not taking: Reported on 05/05/2021 10/16/20   Etta Grandchild, MD  fluconazole (DIFLUCAN) 150 MG tablet Take one tab PO today and repeat dose in 3 days if symptoms persist 07/27/23   Tomi Bamberger, PA-C  fluconazole (DIFLUCAN) 150 MG tablet Take 150 mg by mouth once. 07/06/23   [provider]  fluticasone (FLONASE) 50 MCG/ACT nasal spray Place 1  spray into both nostrils daily for 3 days. 07/17/22 07/20/22  Gustavus Bryant, FNP  ibuprofen (ADVIL) 800 MG tablet Take 1 tablet (800 mg total) by mouth 3 (three) times daily. 10/14/21   White, Elita Boone, NP  imiquimod (ALDARA) 5 % cream Apply 1 Application topically 3 (three) times daily. 06/28/23   [provider]  lidocaine (XYLOCAINE) 2 % solution Use as directed 15 mLs in the mouth or throat as needed for mouth pain. 10/14/21   White, Elita Boone, NP  metroNIDAZOLE (FLAGYL) 500 MG tablet Take 1 tablet (500 mg total) by mouth 2 (two) times daily. 07/09/23   Lamptey, Britta Mccreedy, MD  metroNIDAZOLE (FLAGYL) 500 MG tablet Take 500 mg by mouth 2 (two) times daily. 07/10/23   [provider]  phenazopyridine (PYRIDIUM) 200 MG tablet Take 1 tablet (200 mg total) by mouth 3 (three) times daily. 10/04/21   Cuthriell, Delorise Royals, PA-C      Allergies    Patient has no known allergies.    Review of Systems   Review of Systems  Constitutional:  Negative for chills and fever.  HENT:  Positive for congestion. Negative for trouble swallowing and voice change.   Respiratory:  Positive for cough. Negative for shortness of breath.   All other systems reviewed and are negative.   Physical Exam Updated Vital Signs BP 114/82 (BP Location: Left Arm)   Pulse (!) 102   Temp 98.4 F (  36.9 C) (Oral)   Resp 18   Ht 5\' 2"  (1.575 m)   Wt 65.8 kg   LMP  (LMP Unknown)   SpO2 99%   Breastfeeding Unknown   BMI 26.53 kg/m  Physical Exam Vitals and nursing note reviewed.  Constitutional:      General: She is not in acute distress.    Appearance: Normal appearance. She is not ill-appearing.  HENT:     Head: Normocephalic and atraumatic.     Nose: Nose normal.     Mouth/Throat:     Mouth: Mucous membranes are moist.     Pharynx: No posterior oropharyngeal erythema.  Eyes:     Conjunctiva/sclera: Conjunctivae normal.  Cardiovascular:     Rate and Rhythm: Regular rhythm. Tachycardia present.   Pulmonary:     Effort: Pulmonary effort is normal. No respiratory distress.     Breath sounds: Normal breath sounds. No wheezing or rales.  Musculoskeletal:        General: No deformity. Normal range of motion.     Cervical back: Normal range of motion.  Skin:    Findings: No rash.  Neurological:     Mental Status: She is alert.     ED Results / Procedures / Treatments   Labs (all labs ordered are listed, but only abnormal results are displayed) Labs Reviewed  RESP PANEL BY RT-PCR (RSV, FLU A&B, COVID)  [id number]    EKG None  Radiology No results found.  Procedures Procedures    Medications Ordered in ED Medications - No data to display  ED Course/ Medical Decision Making/ A&P                                 Medical Decision Making Risk Prescription drug management.   27 year old female presents today for concern of URI symptoms.  She states coworkers have been sick with URI symptoms as well as with walking pneumonia.  She is hemodynamically stable.  There is mild tachycardia.  Could be due to dehydration.  She has not been drinking enough fluids.  She has no issues tolerating p.o. intake.  We discussed obtaining x-ray, however given her duration of symptoms, productive cough and negative respiratory panel elected to treat anyways.  Given the empiric treatment she defers x-ray today.  Feel this is not unreasonable.  She is discharged in stable condition.  Strict return precautions given.  Patient voices understanding and is in agreement with plan.   Final Clinical Impression(s) / ED Diagnoses Final diagnoses:  Upper respiratory tract infection, unspecified type    Rx / DC Orders ED Discharge Orders          Ordered    doxycycline (VIBRAMYCIN) 100 MG capsule  2 times daily        09/23/23 1002              Marita [place], [place] 09/23/23 1006    Arby Barrette, MD 09/26/23 2232

## 2023-09-23 NOTE — Discharge Instructions (Signed)
Your exam today was overall reassuring.  COVID, flu, RSV negative.  Given the productive cough and duration of symptoms I have sent antibiotic into the pharmacy for you.  We discussed obtaining an x-ray however given that we your proceeding with treatment and will not change management you have agreed to defer this today.  If any worsening symptoms return to the emergency room.  Establish a primary care provider.  Drink plenty of fluids to keep yourself hydrated.

## 2023-09-23 NOTE — ED Triage Notes (Signed)
Patient is here for evaluation of congestion. Pt reports that she has been unable to sleep well due to congestion and runny nose. Reports that she has a productive cough with green phlegm. Reports her co-workers all have walking pneumonia. Pt reports symptoms X 4 days.

## 2023-09-24 NOTE — ED Notes (Signed)
09/24/23 1600 pt called requesting work note. Writer reprinted her original note for her.

## 2023-11-19 ENCOUNTER — Ambulatory Visit
Admission: EM | Admit: 2023-11-19 | Discharge: 2023-11-19 | Disposition: A | Discharge status: Home / Self Care | Payer: Medicaid Other | Attending: Internal Medicine | Admitting: Internal Medicine

## 2023-11-19 ENCOUNTER — Ambulatory Visit (INDEPENDENT_AMBULATORY_CARE_PROVIDER_SITE_OTHER): Payer: Medicaid Other

## 2023-11-19 DIAGNOSIS — R300 Dysuria: Secondary | ICD-10-CM | POA: Insufficient documentation

## 2023-11-19 DIAGNOSIS — R140 Abdominal distension (gaseous): Secondary | ICD-10-CM

## 2023-11-19 LAB — POCT URINALYSIS DIP (MANUAL ENTRY)
Bilirubin, UA: NEGATIVE
Blood, UA: NEGATIVE
Glucose, UA: NEGATIVE mg/dL
Ketones, POC UA: NEGATIVE mg/dL
Nitrite, UA: NEGATIVE
Protein Ur, POC: 30 mg/dL — AB
Spec Grav, UA: 1.015 (ref 1.010–1.025)
Urobilinogen, UA: 0.2 U/dL
pH, UA: >=9 — AB (ref 5.0–8.0)

## 2023-11-19 LAB — POCT URINE PREGNANCY: Preg Test, Ur: NEGATIVE

## 2023-11-19 MED ORDER — FLUCONAZOLE 150 MG PO TABS: 150.0000 mg | ORAL_TABLET | Freq: Every day | ORAL | 0 refills | Status: DC

## 2023-11-19 MED ORDER — NITROFURANTOIN MONOHYD MACRO 100 MG PO CAPS: 100.0000 mg | ORAL_CAPSULE | Freq: Two times a day (BID) | ORAL | 0 refills | Status: DC

## 2023-11-19 NOTE — ED Provider Notes (Signed)
[facility]    CSN: [id number] Arrival date & time: 11/19/23  1659      History   Chief Complaint Chief Complaint  Patient presents with   Abdominal Pain   Dysuria    HPI Stephanie Floyd is a 28 y.o. female who presents with 2 concerns 1- dysuria x 2 days at the end of voiding. Has had frequent UTI's as child 2- Abdominal bloating after eating since she got back from [place] where she had N/V and was treated with antibiotics. She never had diarrhea.     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 No pertinent surgical history.  OB History     Gravida  1   Para  0   Term  0   Preterm  0   AB  0   Living         SAB  0   IAB  0   Ectopic  0   Multiple      Live Births               Home Medications    Prior to Admission medications   Medication Sig Start Date End Date Taking? Authorizing Provider  fluconazole (DIFLUCAN) 150 MG tablet Take 1 tablet (150 mg total) by mouth daily. 11/19/23  Yes Rodriguez-Southworth, Nettie Elm, PA-C  nitrofurantoin, macrocrystal-monohydrate, (MACROBID) 100 MG capsule Take 1 capsule (100 mg total) by mouth 2 (two) times daily. 11/19/23  Yes Rodriguez-Southworth, Nettie Elm, PA-C    Family History Family History  Problem Relation Age of Onset   Depression Mother    Mental illness Mother     Hyperlipidemia Father    Asthma Brother    Cancer Maternal Grandmother    Cancer Paternal Grandmother     Social History Social History   Tobacco Use   Smoking status: Some Days    Types: E-cigarettes   Smokeless tobacco: Never  Vaping Use   Vaping status: Some Days  Substance Use Topics   Alcohol use: Yes    Alcohol/week: 6.0 standard drinks of alcohol    Types: 3 Cans of beer, 3 Shots of liquor per week    Comment: occasionally   Drug use: No     Allergies   Patient has no known allergies.   Review of Systems Review of Systems   Physical Exam Triage Vital Signs ED Triage Vitals  Encounter Vitals Group     BP 11/19/23 1805 105/70     Systolic BP Percentile --      Diastolic BP Percentile --      Pulse Rate 11/19/23 1805 86     Resp 11/19/23 1805 16     Temp 11/19/23 1805 98.2 F (36.8 C)     Temp Source 11/19/23 1805 Oral     SpO2 11/19/23 1805 98 %     Weight 11/19/23 1806 150 lb (68  kg)     Height 11/19/23 1806 5\' 2"  (1.575 m)     Head Circumference --      Peak Flow --      Pain Score 11/19/23 1812 3     Pain Loc --      Pain Education --      Exclude from Growth Chart --    No data found.  Updated Vital Signs BP 105/70 (BP Location: Left Arm)   Pulse 86   Temp 98.2 F (36.8 C) (Oral)   Resp 16   Ht 5\' 2"  (1.575 m)   Wt 150 lb (68 kg)   LMP 10/27/2023 (Approximate)   SpO2 98%   Breastfeeding No   BMI 27.44 kg/m   Visual Acuity Right Eye Distance:   Left Eye Distance:   Bilateral Distance:    Right Eye Near:   Left Eye Near:    Bilateral Near:     Physical Exam  Physical Exam Vitals and nursing note reviewed.  Constitutional:      General: She is not in acute distress.    Appearance: She is not toxic-appearing.  HENT:     Head: Normocephalic.     Right Ear: External ear normal.     Left Ear: External ear normal.  Eyes:     General: No scleral icterus.    Conjunctiva/sclera: Conjunctivae normal.  Pulmonary:     Effort:  Pulmonary effort is normal.  Abdominal:     General: Bowel sounds are normal.     Palpations: Abdomen is soft. There is no mass.     Tenderness: There is no guarding or rebound.     Comments: - CVA tenderness   Musculoskeletal:        General: Normal range of motion.     Cervical back: Neck supple.      Skin:    General: Skin is warm and dry.     Findings: No rash.  Neurological:     Mental Status: She is alert and oriented to person, place, and time.     Gait: Gait normal.  Psychiatric:        Mood and Affect: Mood normal.        Behavior: Behavior normal.        Thought Content: Thought content normal.        Judgment: Judgment normal.   UC Treatments / Results  Labs (all labs ordered are listed, but only abnormal results are displayed) Labs Reviewed  POCT URINALYSIS DIP (MANUAL ENTRY) - Abnormal; Notable for the following components:      Result Value   pH, UA >=9.0 (*)    Protein Ur, POC =30 (*)    Leukocytes, UA Trace (*)    All other components within normal limits  URINE CULTURE  POCT URINE PREGNANCY    EKG   Radiology DG Abd 1 View Result Date: 11/19/2023 CLINICAL DATA:  Abdominal bloating and constipation EXAM: ABDOMEN - 1 VIEW COMPARISON:  09/02/2015 FINDINGS: Supine frontal view of the abdomen and pelvis excludes hemidiaphragms and pubic symphysis by collimation. No bowel obstruction or ileus. No significant fecal retention. No masses or abnormal calcifications. No acute bony abnormalities. IMPRESSION: 1. Unremarkable bowel gas pattern. Electronically Signed   By: Sharlet Salina M.D.   On: 11/19/2023 19:41    Procedures Procedures (including critical care time)  Medications Ordered in UC Medications - No data to display  Initial Impression / Assessment and Plan / UC Course  I have  reviewed the triage vital signs and the nursing notes.  Pertinent labs & imaging results that were available during my care of the patient were reviewed by me and considered  in my medical decision making (see chart for details).  Bloating UTI  I placed her on Macrobid and Diflucan as noted   Final Clinical Impressions(s) / UC Diagnoses   Final diagnoses:  Abdominal bloating  Dysuria     Discharge Instructions      Get Ginger tea and Anis to make tea with 1 teaspoon of anis in hot water, then strain the seeds and have 2 cups of each one a day to help with your symptoms. It should only take a week to get you better.      ED Prescriptions     Medication Sig Dispense Auth. Provider   nitrofurantoin, macrocrystal-monohydrate, (MACROBID) 100 MG capsule Take 1 capsule (100 mg total) by mouth 2 (two) times daily. 10 capsule Rodriguez-Southworth, Nettie Elm, PA-C   fluconazole (DIFLUCAN) 150 MG tablet Take 1 tablet (150 mg total) by mouth daily. 1 tablet Rodriguez-Southworth, Nettie Elm, PA-C      PDMP not reviewed this encounter.   Garey Ham, Cordelia Poche 11/19/23 2007

## 2023-11-19 NOTE — ED Triage Notes (Signed)
Patient here today with c/o nausea, vomiting, abd pain, and fatigue X 3 week while in [place]. Patient states that she feels bloated after eating and her stomach is rumbling. She was treated in [place] for an intestinal infection with antibiotics, IBU, and something for nausea with some relief but she is still feeling nauseous. Patient returned a week ago and her boyfriend was having some other GI bug and she is not sure if she got something else from him or is this still from [place].   She is also c/o LB pain, dysuria, and frequent urination X 3 days.

## 2023-11-19 NOTE — Discharge Instructions (Signed)
Get Ginger tea and Anis to make tea with 1 teaspoon of anis in hot water, then strain the seeds and have 2 cups of each one a day to help with your symptoms. It should only take a week to get you better.

## 2023-11-21 LAB — URINE CULTURE
Culture: 40000 — AB
Special Requests: NORMAL

## 2023-12-01 ENCOUNTER — Ambulatory Visit
Admission: EM | Admit: 2023-12-01 | Discharge: 2023-12-01 | Disposition: A | Discharge status: Home / Self Care | Payer: Medicaid Other | Attending: Internal Medicine | Admitting: Internal Medicine

## 2023-12-01 DIAGNOSIS — N12 Tubulo-interstitial nephritis, not specified as acute or chronic: Secondary | ICD-10-CM | POA: Diagnosis present

## 2023-12-01 LAB — POCT URINALYSIS DIP (MANUAL ENTRY)
Bilirubin, UA: NEGATIVE
Glucose, UA: NEGATIVE mg/dL
Ketones, POC UA: NEGATIVE mg/dL
Nitrite, UA: NEGATIVE
Protein Ur, POC: 30 mg/dL — AB
Spec Grav, UA: 1.015 (ref 1.010–1.025)
Urobilinogen, UA: 0.2 U/dL
pH, UA: 8.5 — AB (ref 5.0–8.0)

## 2023-12-01 LAB — POCT URINE PREGNANCY: Preg Test, Ur: NEGATIVE

## 2023-12-01 MED ORDER — SULFAMETHOXAZOLE-TRIMETHOPRIM 800-160 MG PO TABS: 1.0000 | ORAL_TABLET | Freq: Two times a day (BID) | ORAL | 0 refills | Status: AC

## 2023-12-01 MED ORDER — FLUCONAZOLE 150 MG PO TABS: 150.0000 mg | ORAL_TABLET | Freq: Once | ORAL | 0 refills | Status: AC

## 2023-12-01 MED ORDER — CEFTRIAXONE SODIUM 1 G IJ SOLR
1.0000 g | Freq: Once | INTRAMUSCULAR | Status: AC
  Administered 2023-12-01: 1 g via INTRAMUSCULAR

## 2023-12-01 NOTE — Discharge Instructions (Signed)
We are treating you for a kidney infection.  We gave you an injection of antibiotics today and I would like you to start sulfamethoxazole-trimethoprim twice daily for 7 days.  If you develop any rash or oral lesions stop the medication to be seen immediately.  We will contact you if your blood work is abnormal.  Follow-up with urology as soon as possible.  If anything worsens and you have fever, abdominal pain, nausea/vomiting, generalized weakness you need to go to the emergency room immediately.

## 2023-12-01 NOTE — ED Provider Notes (Signed)
[facility]    CSN: [id number] Arrival date & time: 12/01/23  0809      History   Chief Complaint Chief Complaint  Patient presents with   UTI Symptoms    HPI Stephanie Floyd is a 28 y.o. female.   Patient presents today with a several day history of recurrent UTI symptoms.  Reports that overnight she started feeling terrible and had subjective fever and chills.  She continues to have urinary symptoms including frequency and urgency as well as left-sided back/flank pain.  She has been taking Alka-Seltzer and Tylenol without improvement of symptoms.  She reports that pain is rated 4/5 on a 0-10 pain scale, no aggravating relieving factors identified.  She was seen and treated for UTI on 11/18/2022 at which point her urine culture grew E. coli sensitive to the prescribed antibiotic (nitrofurantoin).  She did have improvement with this medication but then had recurrence.  She is sexually active but has no concern for STI and denies any significant discharge.  She has been on antibiotics multiple times recently including for upper respiratory infection with doxycycline several months ago.  Denies any medication allergies.  She is on her menstrual cycles as no concern for pregnancy.    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 No pertinent surgical  history.  OB History     Gravida  1   Para  0   Term  0   Preterm  0   AB  0   Living         SAB  0   IAB  0   Ectopic  0   Multiple      Live Births               Home Medications    Prior to Admission medications   Medication Sig Start Date End Date Taking? Authorizing Provider  fluconazole (DIFLUCAN) 150 MG tablet Take 1 tablet (150 mg total) by mouth once for 1 dose. 12/01/23 12/01/23 Yes Harvey Matlack K, PA-C  sulfamethoxazole-trimethoprim (BACTRIM DS) 800-160 MG tablet Take 1 tablet by mouth 2 (two) times daily for 7 days. 12/01/23 12/08/23 Yes Caldwell Kronenberger, Noberto Retort, PA-C    Family History Family History  Problem Relation Age of Onset   Depression Mother    Mental illness Mother    Hyperlipidemia Father    Asthma Brother    Cancer Maternal Grandmother    Cancer Paternal Grandmother     Social History Social History   Tobacco Use   Smoking status: Some Days    Types: E-cigarettes   Smokeless tobacco: Never  Vaping Use   Vaping status: Some Days   Substances: Nicotine, Flavoring  Substance Use Topics   Alcohol use: Yes    Alcohol/week: 6.0 standard drinks of alcohol    Types: 3 Cans of beer, 3 Shots of liquor per week  Comment: occasionally   Drug use: No     Allergies   Patient has no known allergies.   Review of Systems Review of Systems  Constitutional:  Positive for activity change, chills, fatigue and fever. Negative for appetite change.  Respiratory:  Negative for cough and shortness of breath.   Cardiovascular:  Negative for chest pain.  Gastrointestinal:  Negative for abdominal pain, diarrhea, nausea and vomiting.  Genitourinary:  Positive for dysuria, flank pain, frequency and vaginal bleeding (Menstrual cycle). Negative for pelvic pain, urgency, vaginal discharge and vaginal pain.  Musculoskeletal:  Positive for back pain. Negative for arthralgias and myalgias.     Physical Exam Triage Vital Signs ED Triage Vitals  Encounter  Vitals Group     BP 12/01/23 0928 (!) 86/51     Systolic BP Percentile --      Diastolic BP Percentile --      Pulse Rate 12/01/23 0928 78     Resp 12/01/23 0928 18     Temp 12/01/23 0928 98.3 F (36.8 C)     Temp Source 12/01/23 0928 Oral     SpO2 12/01/23 0928 97 %     Weight 12/01/23 0927 154 lb (69.9 kg)     Height 12/01/23 0927 5\' 2"  (1.575 m)     Head Circumference --      Peak Flow --      Pain Score 12/01/23 0924 0     Pain Loc --      Pain Education --      Exclude from Growth Chart --    No data found.  Updated Vital Signs BP 92/60 (BP Location: Left Arm)   Pulse 78   Temp 98.3 F (36.8 C) (Oral)   Resp 18   Ht 5\' 2"  (1.575 m)   Wt 154 lb (69.9 kg)   LMP 11/30/2023 (Exact Date)   SpO2 97%   BMI 28.17 kg/m   Visual Acuity Right Eye Distance:   Left Eye Distance:   Bilateral Distance:    Right Eye Near:   Left Eye Near:    Bilateral Near:     Physical Exam Vitals reviewed.  Constitutional:      General: She is awake. She is not in acute distress.    Appearance: Normal appearance. She is well-developed. She is not ill-appearing.     Comments: Very pleasant female appears stated age in no acute distress sitting comfortably in exam room  HENT:     Head: Normocephalic and atraumatic.  Cardiovascular:     Rate and Rhythm: Normal rate and regular rhythm.     Heart sounds: Normal heart sounds, S1 normal and S2 normal. No murmur heard. Pulmonary:     Effort: Pulmonary effort is normal.     Breath sounds: Normal breath sounds. No wheezing, rhonchi or rales.     Comments: Clear to auscultation bilaterally Abdominal:     General: Bowel sounds are normal.     Palpations: Abdomen is soft.     Tenderness: There is abdominal tenderness in the suprapubic area. There is left CVA tenderness. There is no right CVA tenderness, guarding or rebound.  Psychiatric:        Behavior: Behavior is cooperative.      UC Treatments / Results  Labs (all labs ordered are  listed, but only abnormal results are displayed) Labs Reviewed  POCT URINALYSIS DIP (MANUAL ENTRY) - Abnormal; Notable for the following components:      Result Value   Blood,  UA moderate (*)    pH, UA 8.5 (*)    Protein Ur, POC =30 (*)    Leukocytes, UA Large (3+) (*)    All other components within normal limits  URINE CULTURE  CBC WITH DIFFERENTIAL/PLATELET  BASIC METABOLIC PANEL  POCT URINE PREGNANCY    EKG   Radiology No results found.  Procedures Procedures (including critical care time)  Medications Ordered in UC Medications  cefTRIAXone (ROCEPHIN) injection 1 g (1 g Intramuscular Given 12/01/23 0953)    Initial Impression / Assessment and Plan / UC Course  I have reviewed the triage vital signs and the nursing notes.  Pertinent labs & imaging results that were available during my care of the patient were reviewed by me and considered in my medical decision making (see chart for details).     Patient is well-appearing, afebrile, nontoxic, nontachycardic.  Urine pregnancy was negative.  UA concerning for UTI.  Will treat for complicated UTI/pyelonephritis given her associated.  She was given 1 g of Rocephin in clinic and started on Bactrim DS twice daily for 7 days.  We discussed that if she develops any rash or oral lesions she should stop the medication and be seen immediately.  She is to push fluids.  Urine culture was obtained and we will contact her if need to discontinue or change her antibiotics based on culture results.  Recommended close follow-up with urology and she was given the contact information for local provider with instruction to call to schedule an appointment.  CBC and BMP were obtained and if she has significant leukocytosis or abnormal kidney function she will need to be directed to the emergency room.  We will contact her with these results if anything is abnormal and changes our treatment plan.  We discussed that if her symptoms are improving within a  few days or if anything worsens she needs to go to the ER.  Strict return precautions given.  Excuse note provided.  Final Clinical Impressions(s) / UC Diagnoses   Final diagnoses:  Pyelonephritis     Discharge Instructions      We are treating you for a kidney infection.  We gave you an injection of antibiotics today and I would like you to start sulfamethoxazole-trimethoprim twice daily for 7 days.  If you develop any rash or oral lesions stop the medication to be seen immediately.  We will contact you if your blood work is abnormal.  Follow-up with urology as soon as possible.  If anything worsens and you have fever, abdominal pain, nausea/vomiting, generalized weakness you need to go to the emergency room immediately.     ED Prescriptions     Medication Sig Dispense Auth. Provider   sulfamethoxazole-trimethoprim (BACTRIM DS) 800-160 MG tablet Take 1 tablet by mouth 2 (two) times daily for 7 days. 14 tablet Mariene Dickerman K, PA-C   fluconazole (DIFLUCAN) 150 MG tablet Take 1 tablet (150 mg total) by mouth once for 1 dose. 1 tablet Steffany Schoenfelder, Noberto Retort, PA-C      PDMP not reviewed this encounter.   Jeani Hawking, PA-C 12/01/23 1023

## 2023-12-01 NOTE — ED Triage Notes (Signed)
"  I came in a week ago and was told I have a UTI and put on antibiotics, I feel like it came back worse since last night". "I had Pna in Nov (taken antibiotics) and December (taken antibiotics again) and then recently so concerned with recurrent need for antibiotics". Current symptoms "chills, goose bumps, ? Fever all night, urinary frequency/burning, pee is green".

## 2023-12-03 LAB — CBC WITH DIFFERENTIAL/PLATELET
Basophils Absolute: 0.1 10*3/uL (ref 0.0–0.2)
Basos: 1 %
EOS (ABSOLUTE): 0.1 10*3/uL (ref 0.0–0.4)
Eos: 1 %
Hematocrit: 42.8 % (ref 34.0–46.6)
Hemoglobin: 13.1 g/dL (ref 11.1–15.9)
Immature Grans (Abs): 0 10*3/uL (ref 0.0–0.1)
Immature Granulocytes: 0 %
Lymphocytes Absolute: 1.4 10*3/uL (ref 0.7–3.1)
Lymphs: 23 %
MCH: 27.1 pg (ref 26.6–33.0)
MCHC: 30.6 g/dL — ABNORMAL LOW (ref 31.5–35.7)
MCV: 88 fL (ref 79–97)
Monocytes Absolute: 0.4 10*3/uL (ref 0.1–0.9)
Monocytes: 6 %
Neutrophils Absolute: 4.4 10*3/uL (ref 1.4–7.0)
Neutrophils: 69 %
Platelets: 387 10*3/uL (ref 150–450)
RBC: 4.84 x10E6/uL (ref 3.77–5.28)
RDW: 13.6 % (ref 11.7–15.4)
WBC: 6.4 10*3/uL (ref 3.4–10.8)

## 2023-12-03 LAB — URINE CULTURE: Culture: >=100000 — AB

## 2023-12-03 LAB — BASIC METABOLIC PANEL
BUN/Creatinine Ratio: 14 (ref 9–23)
BUN: 8 mg/dL (ref 6–20)
CO2: 24 mmol/L (ref 20–29)
Calcium: 9.2 mg/dL (ref 8.7–10.2)
Chloride: 102 mmol/L (ref 96–106)
Creatinine, Ser: 0.58 mg/dL (ref 0.57–1.00)
Glucose: 93 mg/dL (ref 70–99)
Potassium: 3.9 mmol/L (ref 3.5–5.2)
Sodium: 138 mmol/L (ref 134–144)
eGFR: 128 mL/min/{1.73_m2} (ref >59)

## 2023-12-23 ENCOUNTER — Ambulatory Visit
Admission: EM | Admit: 2023-12-23 | Discharge: 2023-12-23 | Disposition: A | Discharge status: Home / Self Care | Payer: Medicaid Other | Attending: Physician Assistant | Admitting: Physician Assistant

## 2023-12-23 DIAGNOSIS — N390 Urinary tract infection, site not specified: Secondary | ICD-10-CM | POA: Insufficient documentation

## 2023-12-23 LAB — POCT URINALYSIS DIP (MANUAL ENTRY)
Bilirubin, UA: NEGATIVE
Glucose, UA: NEGATIVE mg/dL
Ketones, POC UA: NEGATIVE mg/dL
Nitrite, UA: NEGATIVE
Protein Ur, POC: NEGATIVE mg/dL
Spec Grav, UA: 1.01 (ref 1.010–1.025)
Urobilinogen, UA: 0.2 U/dL
pH, UA: 7 (ref 5.0–8.0)

## 2023-12-23 MED ORDER — CEPHALEXIN 500 MG PO CAPS: 500.0000 mg | ORAL_CAPSULE | Freq: Four times a day (QID) | ORAL | 0 refills | Status: DC

## 2023-12-23 MED ORDER — FLUCONAZOLE 150 MG PO TABS: 150.0000 mg | ORAL_TABLET | Freq: Every day | ORAL | 1 refills | Status: DC

## 2023-12-23 NOTE — ED Provider Notes (Signed)
 [facility]    CSN: [id number] Arrival date & time: 12/23/23  1226      History   Chief Complaint No chief complaint on file.   HPI Stephanie Floyd is a 28 y.o. female.   Patient complains of UTI symptoms.  Patient has a history of frequent UTIs.  Cultures reviewed and show patient recently has had E. coli.  Patient complains of discomfort with urination.  Patient denies any fever or chills she denies any back pain.  The history is provided by the patient. No language interpreter was used.    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 No pertinent surgical history.  OB History     Gravida  1   Para  0   Term  0   Preterm  0   AB  0   Living         SAB  0   IAB  0   Ectopic  0   Multiple      Live Births               Home Medications    Prior to Admission medications   Medication Sig Start Date End Date Taking? Authorizing Provider  cephALEXin (KEFLEX) 500 MG capsule Take 1 capsule (500 mg total) by mouth 4 (four) times daily. 12/23/23  Yes Cheron Schaumann K, PA-C  fluconazole (DIFLUCAN) 150 MG tablet Take 1 tablet (150 mg total) by mouth daily. 12/23/23  Yes Elson Areas, PA-C    Family History Family History  Problem Relation Age of Onset   Depression Mother    Mental illness Mother    Hyperlipidemia Father     Asthma Brother    Cancer Maternal Grandmother    Cancer Paternal Grandmother     Social History Social History   Tobacco Use   Smoking status: Some Days    Types: E-cigarettes   Smokeless tobacco: Never  Vaping Use   Vaping status: Some Days   Substances: Nicotine, Flavoring  Substance Use Topics   Alcohol use: Yes    Alcohol/week: 6.0 standard drinks of alcohol    Types: 3 Cans of beer, 3 Shots of liquor per week    Comment: occasionally   Drug use: No     Allergies   Patient has no known allergies.   Review of Systems Review of Systems  All other systems reviewed and are negative.    Physical Exam Triage Vital Signs ED Triage Vitals  Encounter Vitals Group     BP 12/23/23 1421 103/66     Systolic BP Percentile --      Diastolic BP Percentile --      Pulse Rate 12/23/23 1421 91     Resp 12/23/23 1421 20     Temp 12/23/23 1421 98.8 F (37.1 C)     Temp Source 12/23/23 1421 Oral  SpO2 12/23/23 1421 98 %     Weight --      Height --      Head Circumference --      Peak Flow --      Pain Score 12/23/23 1423 5     Pain Loc --      Pain Education --      Exclude from Growth Chart --    No data found.  Updated Vital Signs BP 103/66 (BP Location: Left Arm)   Pulse 91   Temp 98.8 F (37.1 C) (Oral)   Resp 20   LMP 11/30/2023 (Exact Date)   SpO2 98%   Visual Acuity Right Eye Distance:   Left Eye Distance:   Bilateral Distance:    Right Eye Near:   Left Eye Near:    Bilateral Near:     Physical Exam Vitals and nursing note reviewed.  Constitutional:      Appearance: She is well-developed.  HENT:     Head: Normocephalic.  Cardiovascular:     Rate and Rhythm: Normal rate.  Pulmonary:     Effort: Pulmonary effort is normal.  Abdominal:     General: There is no distension.  Musculoskeletal:        General: Normal range of motion.     Cervical back: Normal range of motion.  Skin:    General: Skin is warm.  Neurological:     General:  No focal deficit present.     Mental Status: She is alert and oriented to person, place, and time.      UC Treatments / Results  Labs (all labs ordered are listed, but only abnormal results are displayed) Labs Reviewed  POCT URINALYSIS DIP (MANUAL ENTRY) - Abnormal; Notable for the following components:      Result Value   Blood, UA trace-intact (*)    Leukocytes, UA Trace (*)    All other components within normal limits  URINE CULTURE    EKG   Radiology No results found.  Procedures Procedures (including critical care time)  Medications Ordered in UC Medications - No data to display  Initial Impression / Assessment and Plan / UC Course  I have reviewed the triage vital signs and the nursing notes.  Pertinent labs & imaging results that were available during my care of the patient were reviewed by me and considered in my medical decision making (see chart for details).     Patient's UA shows a trace of leukocytes.  Given patient's recent history of frequent UTIs I will treat her with Keflex.  Patient is advised to return if symptoms worsen or change. Final Clinical Impressions(s) / UC Diagnoses   Final diagnoses:  Urinary tract infection without hematuria, site unspecified     Discharge Instructions      Return if any problems.    ED Prescriptions     Medication Sig Dispense Auth. Provider   cephALEXin (KEFLEX) 500 MG capsule Take 1 capsule (500 mg total) by mouth 4 (four) times daily. 28 capsule Jerney Baksh K, [place]   fluconazole (DIFLUCAN) 150 MG tablet Take 1 tablet (150 mg total) by mouth daily. 1 tablet Elson Areas, [place]      PDMP not reviewed this encounter. An After Visit Summary was printed and given to the patient.       Elson Areas, [place] 12/23/23 1450

## 2023-12-23 NOTE — ED Triage Notes (Signed)
 Pt reports she has some hot flashes, fever, frequent urination,  and low abdominal pain x 3 days

## 2023-12-23 NOTE — Discharge Instructions (Addendum)
 Return if any problems.

## 2023-12-24 LAB — URINE CULTURE: Culture: <10000 — AB

## 2024-01-09 ENCOUNTER — Other Ambulatory Visit: Payer: Self-pay

## 2024-01-09 ENCOUNTER — Emergency Department (HOSPITAL_COMMUNITY)

## 2024-01-09 ENCOUNTER — Emergency Department (HOSPITAL_COMMUNITY)
Admission: EM | Admit: 2024-01-09 | Discharge: 2024-01-09 | Disposition: A | Discharge status: Home / Self Care | Attending: Emergency Medicine | Admitting: Emergency Medicine

## 2024-01-09 DIAGNOSIS — N390 Urinary tract infection, site not specified: Secondary | ICD-10-CM | POA: Insufficient documentation

## 2024-01-09 DIAGNOSIS — N898 Other specified noninflammatory disorders of vagina: Secondary | ICD-10-CM | POA: Diagnosis not present

## 2024-01-09 LAB — URINALYSIS, ROUTINE W REFLEX MICROSCOPIC
Bacteria, UA: NONE SEEN
Bilirubin Urine: NEGATIVE
Glucose, UA: NEGATIVE mg/dL
Hgb urine dipstick: NEGATIVE
Ketones, ur: NEGATIVE mg/dL
Nitrite: NEGATIVE
Protein, ur: NEGATIVE mg/dL
Specific Gravity, Urine: 1.004 — ABNORMAL LOW (ref 1.005–1.030)
pH: 9 — ABNORMAL HIGH (ref 5.0–8.0)

## 2024-01-09 LAB — WET PREP, GENITAL
Sperm: NONE SEEN
Trich, Wet Prep: NONE SEEN
WBC, Wet Prep HPF POC: <10 (ref <10)
Yeast Wet Prep HPF POC: NONE SEEN

## 2024-01-09 LAB — CBC
HCT: 40.6 % (ref 36.0–46.0)
Hemoglobin: 13.5 g/dL (ref 12.0–15.0)
MCH: 28 pg (ref 26.0–34.0)
MCHC: 33.3 g/dL (ref 30.0–36.0)
MCV: 84.1 fL (ref 80.0–100.0)
Platelets: 387 10*3/uL (ref 150–400)
RBC: 4.83 MIL/uL (ref 3.87–5.11)
RDW: 13.5 % (ref 11.5–15.5)
WBC: 5.7 10*3/uL (ref 4.0–10.5)
nRBC: 0 % (ref 0.0–0.2)

## 2024-01-09 LAB — HCG, SERUM, QUALITATIVE: Preg, Serum: NEGATIVE

## 2024-01-09 MED ORDER — FLUCONAZOLE 200 MG PO TABS: 200.0000 mg | ORAL_TABLET | Freq: Every day | ORAL | 0 refills | Status: DC

## 2024-01-09 MED ORDER — DOXYCYCLINE HYCLATE 100 MG PO CAPS: 100.0000 mg | ORAL_CAPSULE | Freq: Two times a day (BID) | ORAL | 0 refills | Status: DC

## 2024-01-09 MED ORDER — DOXYCYCLINE HYCLATE 100 MG PO TABS
100.0000 mg | ORAL_TABLET | Freq: Once | ORAL | Status: AC
  Administered 2024-01-09: 100 mg via ORAL
  Filled 2024-01-09: qty 1

## 2024-01-09 NOTE — Discharge Instructions (Addendum)
 Please take doxycycline  Call urology office for follow up Return if you are having any new or worsening symptoms. Please call the number on your discharge paper to arrange for primary care follow-up.

## 2024-01-09 NOTE — ED Triage Notes (Signed)
 Pt arrived reporting LLQ pain and dysuria. Recently treated for UTI. States this has been recurring. Reports no blood visible in urine. Denies N/V

## 2024-01-09 NOTE — ED Provider Notes (Signed)
 [place] EMERGENCY DEPARTMENT AT [facility] Provider Note   CSN: [id number] Arrival date & time: 01/09/24  1004     History  Chief Complaint  Patient presents with   Abdominal Pain   Dysuria    Stephanie Floyd is a 28 y.o. female.  HPI 28 year old female presents today complaining of urinary tract infection.  She reports that she has had multiple urinary tract infections since January.  She has been treated multiple times.  She states that the last time she was treated she was told that she needed to see a urologist.  She does not have any primary care.  Symptoms had resolved for about a week until today.  Symptoms began with some left flank pain followed by feeling like she cannot empty her bladder only being able to urinate in small amounts with pain externally with voiding.  She feels like she gets chills when this starts.  She has not had fever but reports that she has been flushed.  She reports that she has had multiple cultures obtained that show E. coli.  She was last treated with Keflex and Diflucan on 12/23/2023.  She reports that the symptoms did get better with this and she completed the course.  Prior to this on 2 1 she was treated with Bactrim and Diflucan.  On 120 she was treated with Macrobid and Diflucan. Culture from 223 grew less than 10,000 colonies of strep agalactiae, prior to that she grew E. coli on 1/20 and 12/01/2023 She is sexually active with 1 partner for the past 5 months.  She has not noted any abnormal vaginal discharge.  She reports that she gets regular Pap smears and has had STI checks in the past although not recently.     Home Medications Prior to Admission medications   Medication Sig Start Date End Date Taking? Authorizing Provider  doxycycline (VIBRAMYCIN) 100 MG capsule Take 1 capsule (100 mg total) by mouth 2 (two) times daily. 01/09/24  Yes Margarita Grizzle, MD  cephALEXin (KEFLEX) 500 MG capsule Take 1 capsule (500 mg total) by  mouth 4 (four) times daily. 12/23/23   Elson Areas, PA-C  fluconazole (DIFLUCAN) 150 MG tablet Take 1 tablet (150 mg total) by mouth daily. 12/23/23   Elson Areas, PA-C      Allergies    Patient has no known allergies.    Review of Systems   Review of Systems  Physical Exam Updated Vital Signs BP 105/70 (BP Location: Left Arm)   Pulse 71   Temp 98.1 F (36.7 C) (Oral)   Resp 16   Ht 1.575 m (5\' 2" )   Wt 69.9 kg   LMP 12/31/2023   SpO2 100%   BMI 28.19 kg/m  Physical Exam Vitals reviewed.  HENT:     Head: Normocephalic.     Mouth/Throat:     Mouth: Mucous membranes are moist.  Eyes:     Extraocular Movements: Extraocular movements intact.  Cardiovascular:     Rate and Rhythm: Normal rate and regular rhythm.  Pulmonary:     Effort: Pulmonary effort is normal.     Breath sounds: Normal breath sounds.  Abdominal:     General: Abdomen is flat. Bowel sounds are normal.     Palpations: Abdomen is soft.  Genitourinary:    Vagina: Vaginal discharge present.     Cervix: Normal.     Uterus: Not enlarged.      Adnexa: Right adnexa normal and left adnexa  normal.     Comments: Scant white discharge most consistent with physiological discharge No abnormalities of external genitalia Mild tenderness on bimanual exam without any palpable masses No cervical motion tenderness Skin:    General: Skin is warm and dry.  Neurological:     Mental Status: She is alert.     ED Results / Procedures / Treatments   Labs (all labs ordered are listed, but only abnormal results are displayed) Labs Reviewed  WET PREP, GENITAL - Abnormal; Notable for the following components:      Result Value   Clue Cells Wet Prep HPF POC PRESENT (*)    All other components within normal limits  URINALYSIS, ROUTINE W REFLEX MICROSCOPIC - Abnormal; Notable for the following components:   Color, Urine COLORLESS (*)    Specific Gravity, Urine 1.004 (*)    pH 9.0 (*)    Leukocytes,Ua MODERATE (*)     All other components within normal limits  HCG, SERUM, QUALITATIVE  CBC  GC/CHLAMYDIA PROBE AMP ([place]) NOT AT [facility]    EKG None  Radiology CT Renal Stone Study Result Date: 01/09/2024 CLINICAL DATA:  Abdominal/flank pain.  Concern for kidney stone. EXAM: CT ABDOMEN AND PELVIS WITHOUT CONTRAST TECHNIQUE: Multidetector CT imaging of the abdomen and pelvis was performed following the standard protocol without IV contrast. RADIATION DOSE REDUCTION: This exam was performed according to the departmental dose-optimization program which includes automated exposure control, adjustment of the mA and/or kV according to patient size and/or use of iterative reconstruction technique. COMPARISON:  CT abdomen pelvis dated 03/14/2020. FINDINGS: Evaluation of this exam is limited in the absence of intravenous contrast. Lower chest: The visualized lung bases are clear. No intra-abdominal free air or free fluid. Hepatobiliary: No focal liver abnormality is seen. No gallstones, gallbladder wall thickening, or biliary dilatation. Pancreas: Unremarkable. No pancreatic ductal dilatation or surrounding inflammatory changes. Spleen: Normal in size without focal abnormality. Adrenals/Urinary Tract: The adrenal glands unremarkable. There is no hydronephrosis or nephrolithiasis on either side. The visualized ureters and urinary bladder appear unremarkable. Stomach/Bowel: There is no bowel obstruction or active inflammation. The appendix is not visualized with certainty. No inflammatory changes identified in the right lower quadrant. Vascular/Lymphatic: The abdominal aorta and IVC are grossly unremarkable on this noncontrast CT. No portal venous gas. There is no adenopathy. Reproductive: The uterus is anteverted and grossly unremarkable. No suspicious adnexal masses. Other: Post procedural changes of abdominal plasty. No fluid collection. Musculoskeletal: No acute or significant osseous findings. IMPRESSION: 1. No acute  intra-abdominal or pelvic pathology. No hydronephrosis or nephrolithiasis. 2. No bowel obstruction. Electronically Signed   By: Elgie Collard M.D.   On: 01/09/2024 16:25    Procedures Procedures    Medications Ordered in ED Medications  doxycycline (VIBRA-TABS) tablet 100 mg (has no administration in time range)    ED Course/ Medical Decision Making/ A&P Clinical Course as of 01/09/24 1634  Wed Jan 09, 2024  1307 Urinalysis shows 21-50 white blood cells, 0-5 red blood cells, no bacteria, 0-5 squamous epithelial cells and moderate LE CBC reviewed interpreted and normal Pregnancy test is negative [DR]  1627 CT reviewed interpreted and no evidence of acute intra-abdominal or pelvic pathology with no hydro noted [DR]    Clinical Course User Index [DR] Margarita Grizzle, MD                                 Medical Decision Making  Amount and/or Complexity of Data Reviewed Labs: ordered. Radiology: ordered.  28 year old female with recurrent UTIs, left flank pain, she has previously grown out E. coli with last culture showing strep.  Patient began having some symptoms again today.  Patient is evaluated here in the ED with labs that include urinalysis with 21-50 white blood cells, moderate LE, few squamous epithelial cells and 0-5 red blood cells. CBC reviewed with no acute abnormalities. She is not pregnant here.  She had a pelvic exam performed here that shows some clue cells present.  GC and Chlamydia were sent.  Patient has recently received Rocephin.  She has been on metronidazole and still has WBC on wet prep.  Unclear if symptoms today are coming from urinary tract infection.  Patient has been treated with Rocephin.  Considering STIs in differential plan to also cover with doxycycline.  Discussed with pharmacist based on recent cultures.  The strep was thought to be covered by ampicillin.  Should have been covered by the Keflex.  CT pending. Plan referral to  urology         Final Clinical Impression(s) / ED Diagnoses Final diagnoses:  Urinary tract infection without hematuria, site unspecified    Rx / DC Orders ED Discharge Orders          Ordered    doxycycline (VIBRAMYCIN) 100 MG capsule  2 times daily        01/09/24 1614              Margarita Grizzle, MD 01/09/24 1634

## 2024-01-10 LAB — GC/CHLAMYDIA PROBE AMP (~~LOC~~) NOT AT ARMC
Chlamydia: NEGATIVE
Comment: NEGATIVE
Comment: NORMAL
Neisseria Gonorrhea: NEGATIVE

## 2024-02-04 ENCOUNTER — Ambulatory Visit
Admission: EM | Admit: 2024-02-04 | Discharge: 2024-02-04 | Disposition: A | Discharge status: Home / Self Care | Attending: Physician Assistant | Admitting: Physician Assistant

## 2024-02-04 DIAGNOSIS — N3000 Acute cystitis without hematuria: Secondary | ICD-10-CM | POA: Insufficient documentation

## 2024-02-04 LAB — POCT URINALYSIS DIP (MANUAL ENTRY)
Bilirubin, UA: NEGATIVE
Glucose, UA: NEGATIVE mg/dL
Ketones, POC UA: NEGATIVE mg/dL
Nitrite, UA: NEGATIVE
Protein Ur, POC: NEGATIVE mg/dL
Spec Grav, UA: 1.01 (ref 1.010–1.025)
Urobilinogen, UA: 0.2 U/dL
pH, UA: 7 (ref 5.0–8.0)

## 2024-02-04 MED ORDER — FLUCONAZOLE 150 MG PO TABS: ORAL_TABLET | ORAL | 0 refills | Status: DC

## 2024-02-04 MED ORDER — NITROFURANTOIN MONOHYD MACRO 100 MG PO CAPS: 100.0000 mg | ORAL_CAPSULE | Freq: Two times a day (BID) | ORAL | 0 refills | Status: DC

## 2024-02-04 NOTE — Discharge Instructions (Signed)
 Please follow up with primary care as soon as possible.

## 2024-02-04 NOTE — ED Provider Notes (Signed)
 [facility]    CSN: [id number] Arrival date & time: 02/04/24  1009      History   Chief Complaint Chief Complaint  Patient presents with   Back Pain   Urinary Urgency    HPI Stephanie Floyd is a 28 y.o. female.   Patient here today for evaluation of burning in her back and dysuria with associated urinary urgency and frequency. She reports that she seems to be getting recurrent UTIs. She was most recently seen in ED and diagnosed with BV- she notes these symptoms have resolved and she denies any vaginal discharge. She reports she has felt feverish at times- denies abdominal pain. She does not report treatment for symptoms. She does not currently have PCP. She reports recent STD screening and declines same today.  The history is provided by the patient.  Back Pain Associated symptoms: dysuria   Associated symptoms: no abdominal pain and no fever     Past Medical History:  Diagnosis Date   Asthma    Reflux gastritis    Urine incontinence    UTI (urinary tract infection)     Patient Active Problem List   Diagnosis Date Noted   Current moderate episode of major depressive disorder without prior episode (HCC) 10/16/2020   Chronic idiopathic constipation 10/13/2020   Abnormal weight gain 10/13/2020   Encounter for general adult medical examination with abnormal findings 10/13/2020   Cervical cancer screening 10/13/2020   Thrombocytosis 10/13/2020   Need for hepatitis C screening test 10/13/2020   Need for prophylactic vaccination and inoculation against influenza 10/13/2020    History reviewed. No pertinent surgical history.  OB History     Gravida  1   Para  0   Term  0   Preterm  0   AB  0   Living         SAB  0   IAB  0   Ectopic  0   Multiple      Live Births               Home Medications    Prior to Admission medications   Medication Sig Start Date End Date Taking? Authorizing Provider  fluconazole (DIFLUCAN) 150  MG tablet Take one tab PO today and repeat dose in 3 days if symptoms persist 02/04/24  Yes Tomi Bamberger, PA-C  nitrofurantoin, macrocrystal-monohydrate, (MACROBID) 100 MG capsule Take 1 capsule (100 mg total) by mouth 2 (two) times daily. 02/04/24  Yes Tomi Bamberger, PA-C    Family History Family History  Problem Relation Age of Onset   Depression Mother    Mental illness Mother    Hyperlipidemia Father    Asthma Brother    Cancer Maternal Grandmother    Cancer Paternal Grandmother     Social History Social History   Tobacco Use   Smoking status: Former    Types: E-cigarettes   Smokeless tobacco: Never  Vaping Use   Vaping status: Former   Substances: Nicotine, Flavoring  Substance Use Topics   Alcohol use: Yes    Alcohol/week: 6.0 standard drinks of alcohol    Types: 3 Cans of beer, 3 Shots of liquor per week    Comment: occasionally   Drug use: No     Allergies   Patient has no known allergies.   Review of Systems Review of Systems  Constitutional:  Negative for chills and fever.  Eyes:  Negative for discharge and redness.  Respiratory:  Negative  for shortness of breath.   Gastrointestinal:  Negative for abdominal pain, nausea and vomiting.  Genitourinary:  Positive for dysuria, frequency, urgency and vaginal bleeding. Negative for vaginal discharge.  Musculoskeletal:  Positive for back pain.     Physical Exam Triage Vital Signs ED Triage Vitals  Encounter Vitals Group     BP 02/04/24 1044 101/67     Systolic BP Percentile --      Diastolic BP Percentile --      Pulse Rate 02/04/24 1044 81     Resp 02/04/24 1044 18     Temp 02/04/24 1044 98.5 F (36.9 C)     Temp Source 02/04/24 1044 Oral     SpO2 02/04/24 1044 98 %     Weight 02/04/24 1042 154 lb (69.9 kg)     Height 02/04/24 1042 5\' 2"  (1.575 m)     Head Circumference --      Peak Flow --      Pain Score 02/04/24 1042 0     Pain Loc --      Pain Education --      Exclude from Growth Chart  --    No data found.  Updated Vital Signs BP 101/67 (BP Location: Left Arm)   Pulse 81   Temp 98.5 F (36.9 C) (Oral)   Resp 18   Ht 5\' 2"  (1.575 m)   Wt 154 lb (69.9 kg)   LMP 01/27/2024 (Exact Date)   SpO2 98%   BMI 28.17 kg/m   Visual Acuity Right Eye Distance:   Left Eye Distance:   Bilateral Distance:    Right Eye Near:   Left Eye Near:    Bilateral Near:     Physical Exam Vitals and nursing note reviewed.  Constitutional:      General: She is not in acute distress.    Appearance: Normal appearance. She is not ill-appearing.  HENT:     Head: Normocephalic and atraumatic.  Eyes:     Conjunctiva/sclera: Conjunctivae normal.  Cardiovascular:     Rate and Rhythm: Normal rate.  Pulmonary:     Effort: Pulmonary effort is normal. No respiratory distress.  Neurological:     Mental Status: She is alert.  Psychiatric:        Mood and Affect: Mood normal.        Behavior: Behavior normal.        Thought Content: Thought content normal.      UC Treatments / Results  Labs (all labs ordered are listed, but only abnormal results are displayed) Labs Reviewed  POCT URINALYSIS DIP (MANUAL ENTRY) - Abnormal; Notable for the following components:      Result Value   Color, UA light yellow (*)    Clarity, UA cloudy (*)    Blood, UA small (*)    Leukocytes, UA Moderate (2+) (*)    All other components within normal limits  URINE CULTURE    EKG   Radiology No results found.  Procedures Procedures (including critical care time)  Medications Ordered in UC Medications - No data to display  Initial Impression / Assessment and Plan / UC Course  I have reviewed the triage vital signs and the nursing notes.  Pertinent labs & imaging results that were available during my care of the patient were reviewed by me and considered in my medical decision making (see chart for details).    Discussed that UA consistent with UTI. Will treat with macrobid and urine culture  ordered.  Patient requested diflucan as she is prone to yeast infections following antibiotic treatment. Advised follow up with PCP as soon as possible given recurrent UTIs. Patient expresses understanding and was working to set up appointment to establish care with PCP in office.   Final Clinical Impressions(s) / UC Diagnoses   Final diagnoses:  Acute cystitis without hematuria     Discharge Instructions       Please follow up with primary care as soon as possible.     ED Prescriptions     Medication Sig Dispense Auth. Provider   nitrofurantoin, macrocrystal-monohydrate, (MACROBID) 100 MG capsule Take 1 capsule (100 mg total) by mouth 2 (two) times daily. 10 capsule Tomi Bamberger, PA-C   fluconazole (DIFLUCAN) 150 MG tablet Take one tab PO today and repeat dose in 3 days if symptoms persist 2 tablet Tomi Bamberger, PA-C      PDMP not reviewed this encounter.   Tomi Bamberger, PA-C 02/04/24 1554

## 2024-02-04 NOTE — ED Triage Notes (Signed)
"  I do also want to talk about my previous/recent BV infection and treatment"

## 2024-02-04 NOTE — ED Triage Notes (Signed)
"  Since Saturday I have had a burning sensation, chills down my spine and urgency to pee very often". "I keep coming back once/twice per month for this and wish to discuss this". No fever (known), "I feel feverish".

## 2024-02-05 LAB — URINE CULTURE: Culture: <10000 — AB

## 2024-02-28 ENCOUNTER — Ambulatory Visit: Admitting: Family Medicine

## 2024-02-28 ENCOUNTER — Telehealth: Payer: Self-pay | Admitting: Family Medicine

## 2024-02-28 VITALS — BP 117/67 | HR 93 | Ht 62.0 in | Wt 157.0 lb

## 2024-02-28 DIAGNOSIS — K5904 Chronic idiopathic constipation: Secondary | ICD-10-CM | POA: Diagnosis not present

## 2024-02-28 DIAGNOSIS — N390 Urinary tract infection, site not specified: Secondary | ICD-10-CM

## 2024-02-28 DIAGNOSIS — N941 Unspecified dyspareunia: Secondary | ICD-10-CM

## 2024-02-28 DIAGNOSIS — Z0000 Encounter for general adult medical examination without abnormal findings: Secondary | ICD-10-CM

## 2024-02-28 NOTE — Progress Notes (Signed)
 New Patient Office Visit  Subjective    Patient ID: Stephanie Floyd, female    DOB: [birth year]  Age: 28 y.o. MRN: [id number]  CC:  Chief Complaint  Patient presents with   Establish Care    HPI Stephanie Floyd presents to establish care. 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 She lives alone.   LMP 02/27/24  Discussed the use of AI scribe software for clinical note transcription with the patient, who gave verbal consent to proceed.  History of Present Illness Stephanie Floyd is a 28 year old female who presents with urinary and gastrointestinal symptoms.  She has experienced recurrent urinary tract infection symptoms since last fall, with frequent visits to urgent care. Symptoms are severe, causing significant discomfort and anxiety, particularly after sexual intercourse. Despite following preventive measures such as urinating after sex, symptoms persist. Urine tests often show leuks, but cultures frequently return with insignificant growth. She has been treated for bacterial vaginosis multiple times.  Chronic constipation has been present since childhood, initially managed with daily Miralax until advised to stop due to dependency concerns. She now uses fiber supplements and occasionally takes Senna, requiring multiple pills to achieve a normal bowel movement. Bowel movements occur approximately once a week, with some improvement during her menstrual period. She describes a sensation of pressure on her bladder when constipated, which she differentiates from UTI pain. Her family history includes similar gastrointestinal issues, with her mother and grandmother undergoing testing for related concerns.  She experiences pain during and after intercourse, described as pressure in both her bladder and vagina, with a slow urine stream immediately following intercourse. She has not had a gynecological exam since these symptoms began. She terminated a pregnancy in  October and had an ultrasound in December, which was reported as normal. She has not been on birth control since then.  She has tried various supplements and dietary changes to manage her symptoms, including probiotics and cranberry supplements, but reports limited success. She is concerned about the impact of her symptoms on her personal life and relationships.          Outpatient Encounter Medications as of 02/28/2024  Medication Sig   [DISCONTINUED] fluconazole  (DIFLUCAN ) 150 MG tablet Take one tab PO today and repeat dose in 3 days if symptoms persist   [DISCONTINUED] nitrofurantoin , macrocrystal-monohydrate, (MACROBID ) 100 MG capsule Take 1 capsule (100 mg total) by mouth 2 (two) times daily.   No facility-administered encounter medications on file as of 02/28/2024.    Past Medical History:  Diagnosis Date   Anxiety    Asthma    Reflux gastritis    Urine incontinence    UTI (urinary tract infection)     Past Surgical History:  Procedure Laterality Date   COSMETIC SURGERY     liposuction    Family History  Problem Relation Age of Onset   Depression Mother    Mental illness Mother    Hyperlipidemia Father    Asthma Brother    Cancer Maternal Grandmother        ?reproductive organs, unsure   Cancer Paternal Grandfather        testicular    Social History   Socioeconomic History   Marital status: Single    Spouse name: Not on file   Number of children: Not on file   Years of education: Not on file   Highest education level: 12th grade  Occupational History   Not on file  Tobacco Use  Smoking status: Some Days    Types: Cigarettes, E-cigarettes   Smokeless tobacco: Never  Vaping Use   Vaping status: Former   Substances: Nicotine, Flavoring  Substance and Sexual Activity   Alcohol use: Yes    Alcohol/week: 6.0 standard drinks of alcohol    Types: 3 Cans of beer, 3 Shots of liquor per week    Comment: occasionally   Drug use: No   Sexual activity: Yes     Partners: Male    Birth control/protection: Condom, None  Other Topics Concern   Not on file  Social History Narrative   ** Merged History Encounter **       Social Drivers of Health   Financial Resource Strain: Medium Risk (02/28/2024)   Overall Financial Resource Strain (CARDIA)    Difficulty of Paying Living Expenses: Somewhat hard  Food Insecurity: Food Insecurity Present (02/28/2024)   Hunger Vital Sign    Worried About Running Out of Food in the Last Year: Sometimes true    Ran Out of Food in the Last Year: Sometimes true  Transportation Needs: No Transportation Needs (02/28/2024)   PRAPARE - Administrator, Civil Service (Medical): No    Lack of Transportation (Non-Medical): No  Physical Activity: Sufficiently Active (02/28/2024)   Exercise Vital Sign    Days of Exercise per Week: 5 days    Minutes of Exercise per Session: 30 min  Stress: Stress Concern Present (02/28/2024)   [employer] of Occupational Health - Occupational Stress Questionnaire    Feeling of Stress : Rather much  Social Connections: Unknown (02/28/2024)   Social Connection and Isolation Panel [NHANES]    Frequency of Communication with Friends and Family: More than three times a week    Frequency of Social Gatherings with Friends and Family: Never    Attends Religious Services: Never    Database administrator or Organizations: No    Attends Engineer, structural: Not on file    Marital Status: Patient declined  Intimate Partner Violence: Not on file    ROS All review of systems negative except what is listed in the HPI      Objective    BP 117/67   Pulse 93   Ht 5\' 2"  (1.575 m)   Wt 157 lb (71.2 kg)   LMP 01/27/2024 (Exact Date)   SpO2 99%   BMI 28.72 kg/m   Physical Exam Vitals reviewed.  Constitutional:      Appearance: Normal appearance.  Cardiovascular:     Rate and Rhythm: Normal rate and regular rhythm.  Pulmonary:     Effort: Pulmonary effort is normal.      Breath sounds: Normal breath sounds.  Skin:    General: Skin is warm and dry.  Neurological:     Mental Status: She is alert and oriented to person, place, and time.  Psychiatric:        Mood and Affect: Mood normal.        Behavior: Behavior normal.        Thought Content: Thought content normal.        Judgment: Judgment normal.             Assessment & Plan:   Problem List Items Addressed This Visit       Active Problems   Chronic idiopathic constipation - Primary   Relevant Orders   Ambulatory referral to Gastroenterology   Other Visit Diagnoses       Encounter for medical  examination to establish care         Pain in female genitalia on intercourse         Frequent UTI       Relevant Orders   Ambulatory referral to Urology   Mycoplasma / Ureaplasma Culture       Assessment & Plan Recurrent UTIs Recurrent UTIs with dysuria, frequency, urgency,  Previous cultures showed insignificant growth. - Refer to urology for evaluation. 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 Currently asymptomatic for BV but experiences dyspareunia. - Follow-up with gynecologist for examination.   Dyspareunia Dyspareunia with pressure in bladder and vagina during intercourse. No recent gynecological examination.  - Refer to gynecologist for internal examination. - Consider pelvic floor physical therapy if no anatomical cause is found.  Constipation Chronic constipation with infrequent bowel movements and bloating. Family history of similar issues. Previous imaging normal. - Refer to gastroenterologist for evaluation and possible colonoscopy. - Recommend resuming fiber supplements like Benefiber. - Advise using Miralax for short-term relief - Suggest magnesium citrate or milk of magnesia for bowel regulation. - Encourage probiotics and dietary  adjustments.    Return if symptoms worsen or fail to improve.   Everlina Hock, NP

## 2024-02-29 ENCOUNTER — Encounter: Payer: Self-pay | Admitting: Family Medicine

## 2024-02-29 NOTE — Telephone Encounter (Signed)
 FYI

## 2024-02-29 NOTE — Telephone Encounter (Signed)
 Thank you. I referred her to urology, so they can do further workup as needed.

## 2024-02-29 NOTE — Telephone Encounter (Signed)
 There was a mishap with her myoplasm specimen yesterday due to a clinical error. I spoke with the pt this morning asking her if she could come in to redo the urine specimen. Unfortunately she can not come back in any time soon so she asked to cancel the test and she'll inform you if she has any concerns in the future.

## 2024-03-04 LAB — MYCOPLASMA / UREAPLASMA CULTURE

## 2024-03-04 LAB — TIQ-NTM

## 2024-03-09 ENCOUNTER — Encounter (HOSPITAL_COMMUNITY): Payer: Self-pay | Admitting: *Deleted

## 2024-03-09 ENCOUNTER — Emergency Department (HOSPITAL_COMMUNITY)
Admission: EM | Admit: 2024-03-09 | Discharge: 2024-03-09 | Disposition: A | Discharge status: Home / Self Care | Attending: Emergency Medicine | Admitting: Emergency Medicine

## 2024-03-09 ENCOUNTER — Other Ambulatory Visit: Payer: Self-pay

## 2024-03-09 ENCOUNTER — Emergency Department (HOSPITAL_COMMUNITY)

## 2024-03-09 DIAGNOSIS — R300 Dysuria: Secondary | ICD-10-CM | POA: Diagnosis not present

## 2024-03-09 DIAGNOSIS — R109 Unspecified abdominal pain: Secondary | ICD-10-CM | POA: Insufficient documentation

## 2024-03-09 LAB — CBC WITH DIFFERENTIAL/PLATELET
Abs Immature Granulocytes: 0.01 10*3/uL (ref 0.00–0.07)
Basophils Absolute: 0.1 10*3/uL (ref 0.0–0.1)
Basophils Relative: 1 %
Eosinophils Absolute: 0.1 10*3/uL (ref 0.0–0.5)
Eosinophils Relative: 1 %
HCT: 40.5 % (ref 36.0–46.0)
Hemoglobin: 13.3 g/dL (ref 12.0–15.0)
Immature Granulocytes: 0 %
Lymphocytes Relative: 26 %
Lymphs Abs: 2.1 10*3/uL (ref 0.7–4.0)
MCH: 27.8 pg (ref 26.0–34.0)
MCHC: 32.8 g/dL (ref 30.0–36.0)
MCV: 84.6 fL (ref 80.0–100.0)
Monocytes Absolute: 0.6 10*3/uL (ref 0.1–1.0)
Monocytes Relative: 7 %
Neutro Abs: 5.3 10*3/uL (ref 1.7–7.7)
Neutrophils Relative %: 65 %
Platelets: 338 10*3/uL (ref 150–400)
RBC: 4.79 MIL/uL (ref 3.87–5.11)
RDW: 13.2 % (ref 11.5–15.5)
WBC: 8.1 10*3/uL (ref 4.0–10.5)
nRBC: 0 % (ref 0.0–0.2)

## 2024-03-09 LAB — URINALYSIS, ROUTINE W REFLEX MICROSCOPIC
Bilirubin Urine: NEGATIVE
Glucose, UA: NEGATIVE mg/dL
Ketones, ur: NEGATIVE mg/dL
Nitrite: NEGATIVE
Protein, ur: 30 mg/dL — AB
Specific Gravity, Urine: 1.02 (ref 1.005–1.030)
WBC, UA: >50 WBC/hpf (ref 0–5)
pH: 7 (ref 5.0–8.0)

## 2024-03-09 LAB — BASIC METABOLIC PANEL WITH GFR
Anion gap: 6 (ref 5–15)
BUN: 12 mg/dL (ref 6–20)
CO2: 28 mmol/L (ref 22–32)
Calcium: 8.8 mg/dL — ABNORMAL LOW (ref 8.9–10.3)
Chloride: 103 mmol/L (ref 98–111)
Creatinine, Ser: 0.66 mg/dL (ref 0.44–1.00)
GFR, Estimated: >60 mL/min (ref >60)
Glucose, Bld: 95 mg/dL (ref 70–99)
Potassium: 3.7 mmol/L (ref 3.5–5.1)
Sodium: 137 mmol/L (ref 135–145)

## 2024-03-09 LAB — HCG, SERUM, QUALITATIVE: Preg, Serum: NEGATIVE

## 2024-03-09 MED ORDER — FLUCONAZOLE 150 MG PO TABS
150.0000 mg | ORAL_TABLET | Freq: Once | ORAL | Status: AC
  Administered 2024-03-09: 150 mg via ORAL
  Filled 2024-03-09: qty 1

## 2024-03-09 MED ORDER — CEPHALEXIN 500 MG PO CAPS: 500.0000 mg | ORAL_CAPSULE | Freq: Four times a day (QID) | ORAL | 0 refills | Status: DC

## 2024-03-09 NOTE — ED Provider Notes (Addendum)
 [place] EMERGENCY DEPARTMENT AT [facility] Provider Note   CSN: [id number] Arrival date & time: 03/09/24  1236     History  Chief Complaint  Patient presents with   Flank Pain    Stephanie Floyd is a 28 y.o. female.  Patient with a history of concerns for multiple bladder infections since January.  With cultures that are not significant.  Patient's primary care doctor now is beginning the approach with a workup for interstitial cystitis and has made a referral to urology.  Patient has not seen them so far and has not had a cystoscopy.  Patient is here with 2 days worth of left flank pain and burning with urination.  Very similar to what is occurred before.  We last saw patient April 7 culture at that time did not grow a significant amount of bacteria.  Patient states antibiotics really do not seem to help.  Actually noted that aspirin and/or Motrin  seems to help the best.  Patient states that she has stopped alcohol stopped coffee and has not seen any significant improvement with that.       Home Medications Prior to Admission medications   Not on File      Allergies    Patient has no known allergies.    Review of Systems   Review of Systems  Constitutional:  Negative for chills and fever.  HENT:  Negative for ear pain and sore throat.   Eyes:  Negative for pain and visual disturbance.  Respiratory:  Negative for cough and shortness of breath.   Cardiovascular:  Negative for chest pain and palpitations.  Gastrointestinal:  Positive for nausea. Negative for abdominal pain and vomiting.  Genitourinary:  Positive for dysuria and flank pain. Negative for hematuria.  Musculoskeletal:  Positive for back pain. Negative for arthralgias.  Skin:  Negative for color change and rash.  Neurological:  Negative for seizures and syncope.  All other systems reviewed and are negative.   Physical Exam Updated Vital Signs BP 122/82 (BP Location: Left Arm)   Pulse 91    Temp 98.3 F (36.8 C) (Oral)   Ht 1.575 m (5\' 2" )   Wt 71.2 kg   SpO2 100%   BMI 28.73 kg/m  Physical Exam Vitals and nursing note reviewed.  Constitutional:      General: She is not in acute distress.    Appearance: Normal appearance. She is well-developed.  HENT:     Head: Normocephalic and atraumatic.  Eyes:     Extraocular Movements: Extraocular movements intact.     Conjunctiva/sclera: Conjunctivae normal.     Pupils: Pupils are equal, round, and reactive to light.  Cardiovascular:     Rate and Rhythm: Normal rate and regular rhythm.     Heart sounds: No murmur heard. Pulmonary:     Effort: Pulmonary effort is normal. No respiratory distress.     Breath sounds: Normal breath sounds.  Abdominal:     Palpations: Abdomen is soft.     Tenderness: There is no abdominal tenderness. There is no guarding.  Musculoskeletal:        General: No swelling.     Cervical back: Normal range of motion and neck supple.  Skin:    General: Skin is warm and dry.     Capillary Refill: Capillary refill takes less than 2 seconds.  Neurological:     General: No focal deficit present.     Mental Status: She is alert and oriented to person, place,  and time.  Psychiatric:        Mood and Affect: Mood normal.     ED Results / Procedures / Treatments   Labs (all labs ordered are listed, but only abnormal results are displayed) Labs Reviewed  URINALYSIS, ROUTINE W REFLEX MICROSCOPIC - Abnormal; Notable for the following components:      Result Value   APPearance CLOUDY (*)    Hgb urine dipstick SMALL (*)    Protein, ur 30 (*)    Leukocytes,Ua LARGE (*)    Bacteria, UA RARE (*)    All other components within normal limits  URINE CULTURE  HCG, SERUM, QUALITATIVE  CBC WITH DIFFERENTIAL/PLATELET  BASIC METABOLIC PANEL WITH GFR    EKG None  Radiology No results found.  Procedures Procedures    Medications Ordered in ED Medications - No data to display  ED Course/ Medical  Decision Making/ A&P                                 Medical Decision Making Amount and/or Complexity of Data Reviewed Labs: ordered. Radiology: ordered.    Patient's history sounds very concerning for interstitial cystitis.  Urinalysis here today of course suggestive of urinary tract infection with 21-50 red blood cells greater than 50 white blood cells bacteria is only rare nitrite negative.  Will send for culture.  Will get CBC will get basic metabolic panel and will do CT renal study.  Patient's CT renal study in the past was negative.  No known history of renal stones.  There was some evidence of some budding yeast noted in the urine.  This could of course be vaginal or in the urine itself.  Most likely will treat with Diflucan .  CT renal without any acute findings.  Labs are extremely reassuring.  White count is 8.1 hemoglobin 13.3 platelets are normal.  Basic metabolic panel normal renal function normal.  Clinically I am very suspicious that patient has a history of interstitial cystitis.  Will send urine for culture.  Will start her on Keflex  and give her 1 dose of Diflucan  here.  For a little bit of the yeast in the urine.  Which could be actually due to vaginal wash over.  Patient's primary care doctor plugged her into what we think is probably [facility] and she is waiting to hear from them for an appointment.  They just made this arrangement on Wednesday of Thursday just a few days ago.  Final Clinical Impression(s) / ED Diagnoses Final diagnoses:  Left flank pain  Dysuria    Rx / DC Orders ED Discharge Orders     None         Breleigh Carpino, MD 03/09/24 1341    Nicklas Barns, MD 03/09/24 [phone]

## 2024-03-09 NOTE — Discharge Instructions (Signed)
 Take Keflex  as directed.  Urine culture is pending.  Make sure you have definitely follow-up with urology.  Your history and symptoms sound like it could be interstitial cystitis.  Also 1 dose of Diflucan  given here.  As we discussed of his interstitial cystitis your cultures will not ever grow a significant amount of bacteria in the urine because the problem is inflammation of the bladder.

## 2024-03-09 NOTE — ED Triage Notes (Signed)
 Pt states she has been seen multiple times for this complaint of pain in the left flank and epigastric area and treated for UTI's that she is later told that is not the problem. Nausea, pain, burning with urination.

## 2024-03-10 LAB — URINE CULTURE: Culture: <10000 — AB

## 2024-04-01 ENCOUNTER — Encounter: Payer: Self-pay | Admitting: Physician Assistant

## 2024-04-08 NOTE — Progress Notes (Signed)
 Chief Complaint: No chief complaint on file.   History of Present Illness:  Stephanie Floyd is a 28 y.o. female who is seen in consultation from Minna Amass B, NP for evaluation of recurrent pelvic discomfort and lower urinary tract symptoms.  Since beginning of this year, when she had a new sexual partner, she has had several episodes reminiscent of urinary tract infections-dysuria, bladder discomfort, frequency and urgency.  Most of these episodes occurred shortly after having intercourse.  She has presented at least 6 times to [facility] health urgent care or emergency rooms since the beginning of the year for UTI-like episodes.  She has not had positive cultures in any of these visits.  These episodes occurred shortly after intercourse.  She does have chronic constipation.   Past Medical History:  Past Medical History:  Diagnosis Date   Anxiety    Asthma    Reflux gastritis    Urine incontinence    UTI (urinary tract infection)     Past Surgical History:  Past Surgical History:  Procedure Laterality Date   COSMETIC SURGERY     liposuction    Allergies:  No Known Allergies  Family History:  Family History  Problem Relation Age of Onset   Depression Mother    Mental illness Mother    Hyperlipidemia Father    Asthma Brother    Cancer Maternal Grandmother        ?reproductive organs, unsure   Cancer Paternal Grandfather        testicular    Social History:  Social History   Tobacco Use   Smoking status: Some Days    Types: Cigarettes, E-cigarettes   Smokeless tobacco: Never  Vaping Use   Vaping status: Former   Substances: Nicotine, Flavoring  Substance Use Topics   Alcohol use: Yes    Alcohol/week: 6.0 standard drinks of alcohol    Types: 3 Cans of beer, 3 Shots of liquor per week    Comment: occasionally   Drug use: No    Review of symptoms:  Constitutional:  Negative for unexplained weight loss, night sweats, fever, chills ENT:  Negative for  nose bleeds, sinus pain, painful swallowing CV:  Negative for chest pain, shortness of breath, exercise intolerance, palpitations, loss of consciousness Resp:  Negative for cough, wheezing, shortness of breath GI:  Negative for nausea, vomiting, diarrhea, bloody stools GU:  Positives noted in HPI; otherwise negative for gross hematuria, dysuria, urinary incontinence Neuro:  Negative for seizures, poor balance, limb weakness, slurred speech Psych:  Negative for lack of energy, depression, anxiety Endocrine:  Negative for polydipsia, polyuria, symptoms of hypoglycemia (dizziness, hunger, sweating) Hematologic:  Negative for anemia, purpura, petechia, prolonged or excessive bleeding, use of anticoagulants  Allergic:  Negative for difficulty breathing or choking as a result of exposure to anything; no shellfish allergy; no allergic response (rash/itch) to materials, foods  Physical exam: There were no vitals taken for this visit. GENERAL APPEARANCE:  Well appearing, well developed, well nourished, NAD HEENT: Atraumatic, Normocephalic. NECK: Normal appearance LUNGS: Normal inspiratory and expiratory excursion HEART: Regular Rate ABDOMEN: Soft, nontender GU: Normal external genitalia.  Mild bladder tenderness.  Bladder well supported.  Minimal pelvic sidewall tenderness.  No uterine abnormalities. EXTREMITIES: Moves all extremities well.  Without clubbing, cyanosis, or edema. NEUROLOGIC:  Alert and oriented x 3, normal gait, CN II-XII grossly intact.  MENTAL STATUS:  Appropriate. SKIN:  Warm, dry and intact.    Results:   I have reviewed referring/prior physicians  records  I have reviewed urinalysis--clear today  I have reviewed prior urine cultures  I reviewed prior imaging studies--residual urine volume 5 mL  Assessment: Recurrent lower urinary tract symptoms without positive cultures, may be IC   Plan: I gave her an IC information sheet  We discussed dietary management  Also  discussed over-the-counter agents useful for the dysuria  I will have her come back in 2 to 3 months to recheck symptoms

## 2024-04-09 ENCOUNTER — Encounter: Payer: Self-pay | Admitting: Urology

## 2024-04-09 ENCOUNTER — Ambulatory Visit: Admitting: Urology

## 2024-04-09 VITALS — BP 112/76 | HR 96 | Ht 61.0 in | Wt 153.0 lb

## 2024-04-09 DIAGNOSIS — R300 Dysuria: Secondary | ICD-10-CM | POA: Diagnosis not present

## 2024-04-09 DIAGNOSIS — R399 Unspecified symptoms and signs involving the genitourinary system: Secondary | ICD-10-CM

## 2024-04-09 DIAGNOSIS — N390 Urinary tract infection, site not specified: Secondary | ICD-10-CM

## 2024-04-09 LAB — URINALYSIS, ROUTINE W REFLEX MICROSCOPIC
Bilirubin, UA: NEGATIVE
Glucose, UA: NEGATIVE
Ketones, UA: NEGATIVE
Leukocytes,UA: NEGATIVE
Nitrite, UA: NEGATIVE
Protein,UA: NEGATIVE
RBC, UA: NEGATIVE
Specific Gravity, UA: 1.015 (ref 1.005–1.030)
Urobilinogen, Ur: 0.2 mg/dL (ref 0.2–1.0)
pH, UA: 6.5 (ref 5.0–7.5)

## 2024-04-09 LAB — BLADDER SCAN AMB NON-IMAGING: Scan Result: 5

## 2024-04-24 ENCOUNTER — Ambulatory Visit: Admitting: Student

## 2024-04-24 ENCOUNTER — Encounter: Payer: Self-pay | Admitting: Student

## 2024-04-24 ENCOUNTER — Other Ambulatory Visit (HOSPITAL_COMMUNITY)
Admission: RE | Admit: 2024-04-24 | Discharge: 2024-04-24 | Disposition: A | Discharge status: Home / Self Care | Source: Ambulatory Visit | Attending: Student | Admitting: Student

## 2024-04-24 ENCOUNTER — Ambulatory Visit: Payer: Self-pay

## 2024-04-24 VITALS — BP 106/62 | HR 85 | Temp 98.3°F | Resp 16 | Ht 61.0 in | Wt 155.2 lb

## 2024-04-24 DIAGNOSIS — N898 Other specified noninflammatory disorders of vagina: Secondary | ICD-10-CM | POA: Diagnosis not present

## 2024-04-24 DIAGNOSIS — Z202 Contact with and (suspected) exposure to infections with a predominantly sexual mode of transmission: Secondary | ICD-10-CM | POA: Diagnosis present

## 2024-04-24 LAB — URINALYSIS, ROUTINE W REFLEX MICROSCOPIC
Bilirubin Urine: NEGATIVE
Ketones, ur: NEGATIVE
Nitrite: NEGATIVE
Specific Gravity, Urine: <=1.005 — AB (ref 1.000–1.030)
Total Protein, Urine: NEGATIVE
Urine Glucose: NEGATIVE
Urobilinogen, UA: 0.2 (ref 0.0–1.0)
pH: 6 (ref 5.0–8.0)

## 2024-04-24 LAB — POCT URINE PREGNANCY: Preg Test, Ur: NEGATIVE

## 2024-04-24 MED ORDER — METRONIDAZOLE 0.75 % VA GEL: 1.0000 | Freq: Every day | VAGINAL | 0 refills | Status: AC

## 2024-04-24 MED ORDER — FLUCONAZOLE 150 MG PO TABS: 150.0000 mg | ORAL_TABLET | Freq: Every day | ORAL | 0 refills | Status: DC

## 2024-04-24 MED ORDER — CEFTRIAXONE SODIUM 1 G IJ SOLR
1.0000 g | Freq: Once | INTRAMUSCULAR | Status: AC
  Administered 2024-04-24: 1 g via INTRAMUSCULAR

## 2024-04-24 MED ORDER — DOXYCYCLINE HYCLATE 100 MG PO TABS
100.0000 mg | ORAL_TABLET | Freq: Two times a day (BID) | ORAL | 0 refills | Status: AC
Start: 2024-04-24 — End: 2024-05-01

## 2024-04-24 NOTE — Assessment & Plan Note (Signed)
 Pt reports recent Dx of trichomonas, she and partner were treated however concerns for current partner sexual activity and compliance.  Discussed safe sex practices including use of protection and ensuring partner is retested and treated to prevent reinfection. Refrain from sexual activity during treatment. Urine culture pending. Will proactively treating for GC/Chlym/Trich. Rx-Rocephin  IM, doxycycline  100 mg twice daily x 7 days, metronidazole  gel.  RTC clinic if symptoms persist or worsen.  Continue to follow-up with urology and gynecology.

## 2024-04-24 NOTE — Assessment & Plan Note (Signed)
Rx Diflucan

## 2024-04-24 NOTE — Telephone Encounter (Signed)
 FYI Only or Action Required?: FYI only for provider.  Patient was last seen in primary care on 02/28/2024 by [name], NP. Called Nurse Triage reporting Pelvic Pain. Symptoms began several days ago. Interventions attempted: OTC medications: Tylenol. Symptoms are: gradually worsening.  Triage Disposition: See HCP Within 4 Hours (Or PCP Triage)  Patient/caregiver understands and will follow disposition?: Yes  Copied from CRM [id number]. Topic: Clinical - Red Word Triage >> Apr 24, 2024  9:37 AM Carlatta H wrote: [facility] that prompted transfer to Nurse Triage: Patient has been having pelvic pain and nausea for about 2 days   ----------------------------------------------------------------------- From previous Reason for Contact - Scheduling: Patient/patient representative is calling to schedule an appointment. Refer to attachments for appointment information. Reason for Disposition  [1] MILD-MODERATE pain AND [2] constant AND [3] present > 2 hours  Answer Assessment - Initial Assessment Questions 1. LOCATION: Where does it hurt?      pelvic 2. RADIATION: Does the pain shoot anywhere else? (e.g., lower back, groin, thighs)     No  3. ONSET: When did the pain begin? (e.g., minutes, hours or days ago)      2 days 4. SUDDEN: Gradual or sudden onset?     Sudden  5. PATTERN Does the pain come and go, or is it constant?    - If constant: Is it getting better, staying the same, or worsening?      (Note: Constant means the pain never goes away completely; most serious pain is constant and gets worse over time)     - If intermittent: How long does it last? Do you have pain now?     (Note: Intermittent means the pain goes away completely between bouts)     Constant 6. SEVERITY: How bad is the pain?  (e.g., Scale 1-10; mild, moderate, or severe)   - MILD (1-3): doesn't interfere with normal activities, area soft and not tender to touch    - MODERATE (4-7): interferes with  normal activities or awakens from sleep, abdomen tender to touch    - SEVERE (8-10): excruciating pain, doubled over, unable to do any normal activities      Moderate-needed to leave work early today.  7. RECURRENT SYMPTOM: Have you ever had this type of pelvic pain before? If Yes, ask: When was the last time? and What happened that time?      Yes, happens monthly almost feels like uti sx. Has been to urology. Upcoming GI.  8. CAUSE: What do you think is causing the pelvic pain?     unsure 9. RELIEVING/AGGRAVATING FACTORS: What makes it better or worse? (e.g., activity/rest, sexual intercourse, voiding, passing stool)     nothing 10. OTHER SYMPTOMS: Has there been any other symptoms? (e.g., fever, constipation, diarrhea, urine problems, vaginal bleeding, vaginal discharge, or vomiting?       nausea  Additional info: Offer 1st available wit PCP this afternoon but patient states she cannot wait and will go to urgent care, offered 11am appointment with an alternate provider which she has accepted.  Protocols used: Pelvic Pain - Female-A-AH

## 2024-04-24 NOTE — Progress Notes (Signed)
 Subjective:     Patient ID: Stephanie Floyd, female    DOB: 05-09-1996, 28 y.o.   MRN: [id number]  Chief Complaint  Patient presents with   Pelvic Pain    X2 days, +nausea  Since beginning of year she has been experiencing recurrent UTIs, states she has been swabbed multiple times for STIs but not in chart, she gets her GYN care at [facility]- she had tested positive for trich about 4 weeks ago, she and partner completed abx but started having sx's again    HPI Stephanie Floyd 28 year old female presents for acute visit, pelvic pain and mild nausea, no vomiting for two days.  History of recurrent UTI and pyelonephritis has presented to urgent care/ ED multiple times over past 6 months with UTI like symptoms.  She is currently followed by urology. Urology considering diagnosis of IC.  Denies new sexual partners; however concerns regarding partner's sexual activity. Recently treated for Trich- reports 4 weeks ago. Treated with metroniazole PO. Completed course.  Reports partner was treated also for trichomonas, however she reports concerns of partners compliance r/t EtOH use.  Recent antibiotics: 03/09/2024- Cephalexin  02/04/2024- Macrobid  01/09/2024-Doxycycline  12/23/2023 cephalexin  4 times daily 12/01/2023-Pyelonephritis/UTI-  Rocephin , BactrimDS   Reports +burning, +frequency,  with urination, +vaginal discharge-green, +malodorous vaginal discharge, + vaginal itching. Mild nausea, no vomiting.   Denies hematuria, fever, back pain, N/V,  fever, chills, SOB, CP, palpitations, dyspnea, edema, HA, rash, vision changes.      History of Present Illness              Health Maintenance Due  Topic Date Due   Pneumococcal Vaccine 29-60 Years old (1 of 2 - PCV) Never done   Hepatitis B Vaccines (1 of 3 - 19+ 3-dose series) Never done   Meningococcal B Vaccine (2 of 2 - Bexsero SCDM 2-dose series) 05/28/2016   Cervical Cancer Screening (Pap smear)  Never done     Past Medical History:  Diagnosis Date   Anxiety    Asthma    Reflux gastritis    Urine incontinence    UTI (urinary tract infection)     Past Surgical History:  Procedure Laterality Date   COSMETIC SURGERY     liposuction    Family History  Problem Relation Age of Onset   Depression Mother    Mental illness Mother    Hyperlipidemia Father    Asthma Brother    Cancer Maternal Grandmother        ?reproductive organs, unsure   Cancer Paternal Grandfather        testicular    Social History   Socioeconomic History   Marital status: Single    Spouse name: Not on file   Number of children: Not on file   Years of education: Not on file   Highest education level: 12th grade  Occupational History   Not on file  Tobacco Use   Smoking status: Some Days    Types: Cigarettes, E-cigarettes   Smokeless tobacco: Never  Vaping Use   Vaping status: Former   Substances: Nicotine, Flavoring  Substance and Sexual Activity   Alcohol use: Yes    Alcohol/week: 6.0 standard drinks of alcohol    Types: 3 Cans of beer, 3 Shots of liquor per week    Comment: occasionally   Drug use: No   Sexual activity: Yes    Partners: Male    Birth control/protection: Condom, None  Other Topics Concern   Not on file  Social History Narrative   ** Merged History Encounter **       Social Drivers of Health   Financial Resource Strain: Medium Risk (02/28/2024)   Overall Financial Resource Strain (CARDIA)    Difficulty of Paying Living Expenses: Somewhat hard  Food Insecurity: Food Insecurity Present (02/28/2024)   Hunger Vital Sign    Worried About Running Out of Food in the Last Year: Sometimes true    Ran Out of Food in the Last Year: Sometimes true  Transportation Needs: No Transportation Needs (02/28/2024)   PRAPARE - Administrator, Civil Service (Medical): No    Lack of Transportation (Non-Medical): No  Physical Activity: Sufficiently Active (02/28/2024)   Exercise Vital  Sign    Days of Exercise per Week: 5 days    Minutes of Exercise per Session: 30 min  Stress: Stress Concern Present (02/28/2024)   [employer] of Occupational Health - Occupational Stress Questionnaire    Feeling of Stress : Rather much  Social Connections: Unknown (02/28/2024)   Social Connection and Isolation Panel    Frequency of Communication with Friends and Family: More than three times a week    Frequency of Social Gatherings with Friends and Family: Never    Attends Religious Services: Never    Database administrator or Organizations: No    Attends Engineer, structural: Not on file    Marital Status: Patient declined  Catering manager Violence: Not on file    Outpatient Medications Prior to Visit  Medication Sig Dispense Refill   cephALEXin  (KEFLEX ) 500 MG capsule Take 1 capsule (500 mg total) by mouth 4 (four) times daily. (Patient not taking: Reported on 04/24/2024) 28 capsule 0   No facility-administered medications prior to visit.    No Known Allergies  ROS See HPI    Objective:    Physical Exam  General: No acute distress. Awake and conversant. Afebrile. Eyes: Normal conjunctiva, anicteric. Round symmetric pupils.  Respiratory: CTAB. Respirations are non-labored. No wheezing.  Skin: Warm, Dry, Intact Psych: Alert and oriented. Cooperative, Appropriate mood and affect, Normal judgment.  CV: RRR. No murmur.  GI: Abdomen soft, non-tender GU: No CVA tenderness. MSK: Normal ambulation. No clubbing or cyanosis.  Neuro:  CN II-XII grossly normal.    BP 106/62   Pulse 85   Temp 98.3 F (36.8 C) (Oral)   Resp 16   Ht 5' 1 (1.549 m)   Wt 155 lb 4 oz (70.4 kg)   LMP 03/29/2024 (Exact Date)   SpO2 100%   BMI 29.33 kg/m  Wt Readings from Last 3 Encounters:  04/24/24 155 lb 4 oz (70.4 kg)  04/09/24 153 lb (69.4 kg)  03/09/24 157 lb 1.2 oz (71.2 kg)       Assessment & Plan:   Problem List Items Addressed This Visit     STD exposure - Primary    Pt reports recent Dx of trichomonas, she and partner were treated however concerns for current partner sexual activity and compliance.  Discussed safe sex practices including use of protection and ensuring partner is retested and treated to prevent reinfection. Refrain from sexual activity during treatment. Urine culture pending. Will proactively treating for GC/Chlym/Trich. Rx-Rocephin  IM, doxycycline  100 mg twice daily x 7 days, metronidazole  gel.  RTC clinic if symptoms persist or worsen.  Continue to follow-up with urology and gynecology.      Relevant Medications   doxycycline  (VIBRA -TABS) 100 MG tablet   metroNIDAZOLE  (METROGEL ) 0.75 % vaginal  gel   cefTRIAXone  (ROCEPHIN ) injection 1 g   fluconazole  (DIFLUCAN ) 150 MG tablet   Other Relevant Orders   POCT urine pregnancy   Urinalysis, Routine w reflex microscopic   Urine Culture   Cervicovaginal ancillary only( [facility])   Vaginal itching   Rx- Diflucan         I have discontinued Heron Nora Garcia's cephALEXin . I am also having her start on doxycycline , metroNIDAZOLE , and fluconazole . We will continue to administer cefTRIAXone .  Meds ordered this encounter  Medications   doxycycline  (VIBRA -TABS) 100 MG tablet    Sig: Take 1 tablet (100 mg total) by mouth 2 (two) times daily for 7 days.    Dispense:  14 tablet    Refill:  0    Supervising Provider:   DOMENICA BLACKBIRD A [[id number]]   metroNIDAZOLE  (METROGEL ) 0.75 % vaginal gel    Sig: Place 1 Applicatorful vaginally at bedtime for 5 days.    Dispense:  50 g    Refill:  0    Supervising Provider:   DOMENICA BLACKBIRD A [[id number]]   cefTRIAXone  (ROCEPHIN ) injection 1 g   fluconazole  (DIFLUCAN ) 150 MG tablet    Sig: Take 1 tablet (150 mg total) by mouth daily. May repeat in 3 days if needed.    Dispense:  2 tablet    Refill:  0    Supervising Provider:   DOMENICA BLACKBIRD A [[id number]]

## 2024-04-25 ENCOUNTER — Ambulatory Visit: Payer: Self-pay | Admitting: Student

## 2024-04-25 LAB — URINE CULTURE
MICRO NUMBER:: 16628931
Result:: NO GROWTH
SPECIMEN QUALITY:: ADEQUATE

## 2024-04-25 LAB — CERVICOVAGINAL ANCILLARY ONLY
Chlamydia: NEGATIVE
Comment: NEGATIVE
Comment: NEGATIVE
Comment: NORMAL
Neisseria Gonorrhea: NEGATIVE
Trichomonas: NEGATIVE

## 2024-05-19 ENCOUNTER — Ambulatory Visit: Payer: Self-pay

## 2024-05-19 NOTE — Telephone Encounter (Signed)
 FYI Only or Action Required?: FYI only for provider.  Patient was last seen in primary care on 04/24/2024 by Wheeler Harlene CROME, NP.  Called Nurse Triage reporting Laceration.  Symptoms began several days ago.  Interventions attempted: Nothing.  Symptoms are: gradually worsening.  Triage Disposition: See HCP Within 4 Hours (Or PCP Triage)  Patient/caregiver understands and will follow disposition?: Unsure  Copied from CRM [id number]. Topic: Clinical - Red Word Triage >> May 19, 2024 10:57 AM Suzen RAMAN wrote: Red Word that prompted transfer to Nurse Triage: Deep Cut(possible infected) with pulse Reason for Disposition  [1] Looks infected (e.g., spreading redness, pus) AND [2] large red area (> 2 inches or 5 cm) or streak  Answer Assessment - Initial Assessment Questions 1. APPEARANCE of INJURY: What does the injury look like?      Red and draining pus 2. ONSET: How long ago did the injury occur?      2 days ago 3. LOCATION: Where is the injury located?      finger 4. SIZE: How large is the cut?      No bigger than an inch 5. BLEEDING: Is it bleeding now? If Yes, ask: Is it difficult to stop?      Denies bleeding currently 6. PAIN: Is there any pain? If Yes, ask: How bad is the pain? (Scale 0-10; or none, mild, moderate, severe)     moderate 7. MECHANISM: Tell me how it happened.      Kitchen knife, was cutting bell peppers,  8. TETANUS: When was your last tetanus booster?     unsure 9. PREGNANCY: Is there any chance you are pregnant? When was your last menstrual period?     Denies  No appts available until 05/21/24, pt advised to utilize UC. Pt agreeable.  Protocols used: Cuts and Lacerations-A-AH

## 2024-05-27 ENCOUNTER — Ambulatory Visit: Admitting: Physician Assistant

## 2024-06-17 NOTE — Progress Notes (Unsigned)
 06/18/2024 Stephanie Floyd [id number] [birth year]  Referring provider: [name], NP Primary GI doctor: Dr. Charlanne  ASSESSMENT AND PLAN:  Chronic constipation lifelong BM once a week, associated bloating, early satiety, nausea, rare sharp AB pain fleeting, rare BRB on TP, on stool, small amount/large Miralax stopped working after years of use, has used dulcolax, MOM, magnesium citrate 11/19/2023 KUB without significant fecal retention no obstruction 01/09/2024 and 03/09/2024 CT renal stone study no acute abnormality ifelong constipation with infrequent bowel movements, bloating, and hard stools. Ineffective response to Miralax and laxatives. Discussed slow colonic transit and dietary factors. - Check inflammatory markers and celiac disease. - Check thyroid  function. - Provided FODMAP diet information. - Recommended kiwi and dragon fruit for dietary fiber. - Continue magnesium supplementation. - Provided Linzess samples; start with 145 mcg, adjust as needed. - Encourage use of a Training and development officer. - Consider SITS marker test for colonic transit time. - referred to pelvic floor PT  History of frequent UTI/bacterial vaginosis following with Dr. [name] urology, last seen 04/09/2024 Sounds like could be pelvic floor dysfunction related Will refer to pelvic floor pt  iron deficiency anemia 09/2020 Ferritin 11, iron 144, CBC Hgb 13.8, MCV 80, platelets 416 No evidence of anemia since that time Has menses, can be heavy to light but more often heavy - recheck iron/ferritin  Patient Care Team: [name], NP as PCP - General (Family Medicine) [name], MD (Internal Medicine)  HISTORY OF PRESENT ILLNESS: 28 y.o. female with a past medical history listed below presents as a new patient for evaluation of chronic idiopathic constipation.   Discussed the use of AI scribe software for clinical note transcription with the patient, who gave verbal consent to  proceed.  History of Present Illness   Stephanie Floyd is a 28 year old female who presents with worsening chronic constipation and bloating.  She has experienced constipation since childhood, with bowel movements approximately once a week. At 35-9 years old, she began experiencing pain and was advised to take Miralax daily. Over time, the effectiveness of Miralax diminished, requiring increased doses without relief. Currently, she experiences severe bloating and discomfort, feeling 'full all day' even with minimal food intake. She occasionally experiences nausea and reports sharp, fleeting abdominal pain about once a week. Her stools are very hard, and she rarely feels the urge to defecate.  Her attempts to manage constipation have included various over-the-counter laxatives such as Dulcolax, milk of magnesia, and prune-based products, with limited success. Recently, she has been taking magnesium supplements, which have improved her bowel movements to once daily when taken consistently. She reports occasional bright red blood on toilet paper, which she attributes to straining. No significant weight loss, but she notes a weight gain of about ten pounds.  Her family history includes gastrointestinal issues on her mother's side, with her mother and grandmother experiencing stomach problems, benign tumors, and gastritis. She is not close to her mother and is unsure of specific diagnoses.  Socially, she consumes alcohol infrequently, about once every two weeks, and denies any drug use or smoking.  She has a history of recurrent urinary tract infection symptoms, which have been investigated by various specialists, including gynecology and urology, with no definitive findings. She describes a sensation of pressure and discomfort rather than pain. Her menstrual history includes periods that have become heavier over time, with either very heavy or very light bleeding, but no significant pain. She  recalls an iron deficiency noted  in December 2021, but no surgical history related to this.      She  reports that she has been smoking cigarettes and e-cigarettes. She has never used smokeless tobacco. She reports current alcohol use of about 6.0 standard drinks of alcohol per week. She reports that she does not use drugs.  RELEVANT GI HISTORY, IMAGING AND LABS: Results   RADIOLOGY Renal stone CT: No abnormality (12/2023) Renal stone CT: No abnormality (02/2024)      CBC    Component Value Date/Time   WBC 8.1 03/09/2024 1311   RBC 4.79 03/09/2024 1311   HGB 13.3 03/09/2024 1311   HGB 13.1 12/01/2023 0947   HCT 40.5 03/09/2024 1311   HCT 42.8 12/01/2023 0947   PLT 338 03/09/2024 1311   PLT 387 12/01/2023 0947   MCV 84.6 03/09/2024 1311   MCV 88 12/01/2023 0947   MCH 27.8 03/09/2024 1311   MCHC 32.8 03/09/2024 1311   RDW 13.2 03/09/2024 1311   RDW 13.6 12/01/2023 0947   LYMPHSABS 2.1 03/09/2024 1311   LYMPHSABS 1.4 12/01/2023 0947   MONOABS 0.6 03/09/2024 1311   EOSABS 0.1 03/09/2024 1311   EOSABS 0.1 12/01/2023 0947   BASOSABS 0.1 03/09/2024 1311   BASOSABS 0.1 12/01/2023 0947   Recent Labs    12/01/23 0947 01/09/24 1021 03/09/24 1311  HGB 13.1 13.5 13.3    CMP     Component Value Date/Time   NA 137 03/09/2024 1311   NA 138 12/01/2023 0947   K 3.7 03/09/2024 1311   CL 103 03/09/2024 1311   CO2 28 03/09/2024 1311   GLUCOSE 95 03/09/2024 1311   BUN 12 03/09/2024 1311   BUN 8 12/01/2023 0947   CREATININE 0.66 03/09/2024 1311   CALCIUM 8.8 (L) 03/09/2024 1311   PROT 7.7 10/13/2020 1032   ALBUMIN 4.5 10/13/2020 1032   AST 17 10/13/2020 1032   ALT 12 10/13/2020 1032   ALKPHOS 60 10/13/2020 1032   BILITOT 0.4 10/13/2020 1032   GFRNONAA >60 03/09/2024 1311   GFRAA >60 03/14/2020 0048      Latest Ref Rng & Units 10/13/2020   10:32 AM 03/14/2020   12:48 AM 01/29/2018    6:07 AM  Hepatic Function  Total Protein 6.0 - 8.3 g/dL 7.7  7.3  7.8   Albumin 3.5 -  5.2 g/dL 4.5  4.0  4.3   AST 0 - 37 U/L 17  25  21    ALT 0 - 35 U/L 12  21  17    Alk Phosphatase 39 - 117 U/L 60  65  57   Total Bilirubin 0.2 - 1.2 mg/dL 0.4  0.3  0.9   Bilirubin, Direct 0.0 - 0.3 mg/dL 0.1         Current Medications:  No current outpatient medications on file.  Medical History:  Past Medical History:  Diagnosis Date   Anxiety    Asthma    Reflux gastritis    Urine incontinence    UTI (urinary tract infection)    Allergies: No Known Allergies   Surgical History:  She  has a past surgical history that includes Cosmetic surgery. Family History:  Her family history includes Asthma in her brother; Cancer in her maternal grandmother and paternal grandfather; Colon polyps in her mother; Depression in her mother; Hyperlipidemia in her father; Mental illness in her mother.  REVIEW OF SYSTEMS  : All other systems reviewed and negative except where noted in the History of Present Illness.  PHYSICAL  EXAM: BP 102/74   Pulse 73   Ht 5' 1 (1.549 m)   Wt 156 lb (70.8 kg)   BMI 29.48 kg/m  Physical Exam   GENERAL APPEARANCE: Well nourished, in no apparent distress. HEENT: No cervical lymphadenopathy, unremarkable thyroid , sclerae anicteric, conjunctiva pink. RESPIRATORY: Respiratory effort normal, breath sounds equal bilaterally without rales, rhonchi, or wheezing. CARDIO: Regular rate and rhythm with no murmurs, rubs, or gallops, peripheral pulses intact. ABDOMEN: Soft, non-distended, active bowel sounds in all four quadrants, no tenderness to palpation, no rebound, no mass appreciated. RECTAL: Declines. MUSCULOSKELETAL: Full range of motion, normal gait, without edema. SKIN: Dry, intact without rashes or lesions. No jaundice. NEURO: Alert, oriented, no focal deficits. PSYCH: Cooperative, normal mood and affect.      [name], PA-C 11:46 AM

## 2024-06-18 ENCOUNTER — Ambulatory Visit: Admitting: Physician Assistant

## 2024-06-18 ENCOUNTER — Encounter: Payer: Self-pay | Admitting: Physician Assistant

## 2024-06-18 ENCOUNTER — Other Ambulatory Visit (INDEPENDENT_AMBULATORY_CARE_PROVIDER_SITE_OTHER)

## 2024-06-18 VITALS — BP 102/74 | HR 73 | Ht 61.0 in | Wt 156.0 lb

## 2024-06-18 DIAGNOSIS — K5904 Chronic idiopathic constipation: Secondary | ICD-10-CM

## 2024-06-18 DIAGNOSIS — D509 Iron deficiency anemia, unspecified: Secondary | ICD-10-CM | POA: Diagnosis not present

## 2024-06-18 DIAGNOSIS — R140 Abdominal distension (gaseous): Secondary | ICD-10-CM

## 2024-06-18 DIAGNOSIS — K5909 Other constipation: Secondary | ICD-10-CM

## 2024-06-18 DIAGNOSIS — N3010 Interstitial cystitis (chronic) without hematuria: Secondary | ICD-10-CM

## 2024-06-18 DIAGNOSIS — Z87448 Personal history of other diseases of urinary system: Secondary | ICD-10-CM

## 2024-06-18 LAB — COMPREHENSIVE METABOLIC PANEL WITH GFR
ALT: 23 U/L (ref 0–35)
AST: 24 U/L (ref 0–37)
Albumin: 4.5 g/dL (ref 3.5–5.2)
Alkaline Phosphatase: 48 U/L (ref 39–117)
BUN: 9 mg/dL (ref 6–23)
CO2: 29 meq/L (ref 19–32)
Calcium: 9.6 mg/dL (ref 8.4–10.5)
Chloride: 102 meq/L (ref 96–112)
Creatinine, Ser: 0.61 mg/dL (ref 0.40–1.20)
GFR: 122.81 mL/min (ref >60.00)
Glucose, Bld: 92 mg/dL (ref 70–99)
Potassium: 4.2 meq/L (ref 3.5–5.1)
Sodium: 139 meq/L (ref 135–145)
Total Bilirubin: 0.4 mg/dL (ref 0.2–1.2)
Total Protein: 7.6 g/dL (ref 6.0–8.3)

## 2024-06-18 LAB — TSH: TSH: 1.67 u[IU]/mL (ref 0.35–5.50)

## 2024-06-18 LAB — CBC WITH DIFFERENTIAL/PLATELET
Basophils Absolute: 0.1 K/uL (ref 0.0–0.1)
Basophils Relative: 1 % (ref 0.0–3.0)
Eosinophils Absolute: 0.1 K/uL (ref 0.0–0.7)
Eosinophils Relative: 2.1 % (ref 0.0–5.0)
HCT: 43.8 % (ref 36.0–46.0)
Hemoglobin: 14.7 g/dL (ref 12.0–15.0)
Lymphocytes Relative: 26.8 % (ref 12.0–46.0)
Lymphs Abs: 1.3 K/uL (ref 0.7–4.0)
MCHC: 33.6 g/dL (ref 30.0–36.0)
MCV: 83.7 fl (ref 78.0–100.0)
Monocytes Absolute: 0.5 K/uL (ref 0.1–1.0)
Monocytes Relative: 9.3 % (ref 3.0–12.0)
Neutro Abs: 2.9 K/uL (ref 1.4–7.7)
Neutrophils Relative %: 60.8 % (ref 43.0–77.0)
Platelets: 401 K/uL — ABNORMAL HIGH (ref 150.0–400.0)
RBC: 5.24 Mil/uL — ABNORMAL HIGH (ref 3.87–5.11)
RDW: 13.4 % (ref 11.5–15.5)
WBC: 4.8 K/uL (ref 4.0–10.5)

## 2024-06-18 LAB — SEDIMENTATION RATE: Sed Rate: 21 mm/h — ABNORMAL HIGH (ref 0–20)

## 2024-06-18 LAB — IBC + FERRITIN
Ferritin: 12.5 ng/mL (ref 10.0–291.0)
Iron: 78 ug/dL (ref 42–145)
Saturation Ratios: 18.1 % — ABNORMAL LOW (ref 20.0–50.0)
TIBC: 429.8 ug/dL (ref 250.0–450.0)
Transferrin: 307 mg/dL (ref 212.0–360.0)

## 2024-06-18 LAB — C-REACTIVE PROTEIN: CRP: <1 mg/dL (ref 0.5–20.0)

## 2024-06-18 NOTE — Patient Instructions (Addendum)
 Your provider has requested that you go to the basement level for lab work before leaving today. Press B on the elevator. The lab is located at the first door on the left as you exit the elevator.  Linzess 145 mcg and 290 mcg, all with which one works *IBS-C patients may begin to experience relief from belly pain and overall abdominal symptoms (pain, discomfort, and bloating) in about 1 week,  with symptoms typically improving over 12 weeks.  Take at least 30 minutes before the first meal of the day on an empty stomach You can have a loose stool if you eat a high-fat breakfast. Give it at least 7 days, may have more bowel movements during that time.   The diarrhea should go away and you should start having normal, complete, full bowel movements.  It may be helpful to start treatment when you can be near the comfort of your own bathroom, such as a weekend.  After you are out we can send in a prescription if you did well, there is a prescription card  Toileting tips to help with your constipation - Drink at least 64-80 ounces of water/liquid per day. - Establish a time to try to move your bowels every day.  For many people, this is after a cup of coffee or after a meal such as breakfast. - Sit all of the way back on the toilet keeping your back fairly straight and while sitting up, try to rest the tops of your forearms on your upper thighs.   - Raising your feet with a step stool/squatty potty can be helpful to improve the angle that allows your stool to pass through the rectum. - Relax the rectum feeling it bulge toward the toilet water.  If you feel your rectum raising toward your body, you are contracting rather than relaxing. - Breathe in and slowly exhale. Belly breath by expanding your belly towards your belly button. Keep belly expanded as you gently direct pressure down and back to the anus.  A low pitched GRRR sound can assist with increasing intra-abdominal pressure.  (Can also trying  to blow on a pinwheel and make it move, this helps with the same belly breathing) - Repeat 3-4 times. If unsuccessful, contract the pelvic floor to restore normal tone and get off the toilet.  Avoid excessive straining. - To reduce excessive wiping by teaching your anus to normally contract, place hands on outer aspect of knees and resist knee movement outward.  Hold 5-10 second then place hands just inside of knees and resist inward movement of knees.  Hold 5 seconds.  Repeat a few times each way.  Go to the ER if unable to pass gas, severe AB pain, unable to hold down food, any shortness of breath of chest pain.    FODMAP stands for fermentable oligo-, di-, mono-saccharides and polyols (1). These are the scientific terms used to classify groups of carbs that are difficult for our body to digest and that are notorious for triggering digestive symptoms like bloating, gas, loose stools and stomach pain.   You can try low FODMAP diet  - start with eliminating just one column at a time that you feel may be a trigger for you. - the table at the very bottom contains foods that are low in FODMAPs   Sometimes trying to eliminate the FODMAP's from your diet is difficult or tricky, if you are stuggling with trying to do the elimination diet you can try an enzyme.  There is a food enzymes that you sprinkle in or on your food that helps break down the FODMAP. You can read more about the enzyme by going to this site: https://fodzyme.com/    Here some information about pelvic floor dysfunction. This may be contributing to some of your symptoms. We will continue with our evaluation but I do want you to consider adding on fiber supplement with low-dose MiraLAX daily. We could also refer to pelvic floor physical therapy.   Pelvic Floor Dysfunction, Female Pelvic floor dysfunction (PFD) is a condition that results when the group of muscles and connective tissues that support the organs in the pelvis  (pelvic floor muscles) do not work well. These muscles and their connections form a sling that supports the colon and bladder. In women, they also support the uterus. PFD causes pelvic floor muscles to be too weak, too tight, or both. In PFD, muscle movements are not coordinated. This may cause bowel or bladder problems. It may also cause pain. What are the causes? This condition may be caused by an injury to the pelvic area or by a weakening of pelvic muscles. This often results from pregnancy and childbirth or other types of strain. In many cases, the exact cause is not known. What increases the risk? The following factors may make you more likely to develop this condition: Having chronic bladder tissue inflammation (interstitial cystitis). Being an older person. Being overweight. History of radiation treatment for cancer in the pelvic region. Previous pelvic surgery, such as removal of the uterus (hysterectomy). What are the signs or symptoms? Symptoms of this condition vary and may include: Bladder symptoms, such as: Trouble starting urination and emptying the bladder. Frequent urinary tract infections. Leaking urine when coughing, laughing, or exercising (stress incontinence). Having to pass urine urgently or frequently. Pain when passing urine. Bowel symptoms, such as: Constipation. Urgent or frequent bowel movements. Incomplete bowel movements. Painful bowel movements. Leaking stool or gas. Unexplained genital or rectal pain. Genital or rectal muscle spasms. Low back pain. Other symptoms may include: A heavy, full, or aching feeling in the vagina. A bulge that protrudes into the vagina. Pain during or after sex. How is this diagnosed? This condition may be diagnosed based on: Your symptoms and medical history. A physical exam. During the exam, your health care provider may check your pelvic muscles for tightness, spasm, pain, or weakness. This may include a rectal exam and a  pelvic exam. In some cases, you may have diagnostic tests, such as: Electrical muscle function tests. Urine flow testing. X-ray tests of bowel function. Ultrasound of the pelvic organs. How is this treated? Treatment for this condition depends on the symptoms. Treatment options include: Physical therapy. This may include Kegel exercises to help relax or strengthen the pelvic floor muscles. Biofeedback. This type of therapy provides feedback on how tight your pelvic floor muscles are so that you can learn to control them. Internal or external massage therapy. A treatment that involves electrical stimulation of the pelvic floor muscles to help control pain (transcutaneous electrical nerve stimulation, or TENS). Sound wave therapy (ultrasound) to reduce muscle spasms. Medicines, such as: Muscle relaxants. Bladder control medicines. Surgery to reconstruct or support pelvic floor muscles may be an option if other treatments do not help. Follow these instructions at home: Activity Do your usual activities as told by your health care provider. Ask your health care provider if you should modify any activities. Do pelvic floor strengthening or relaxing exercises at home as told  by your physical therapist. Lifestyle Maintain a healthy weight. Eat foods that are high in fiber, such as beans, whole grains, and fresh fruits and vegetables. Limit foods that are high in fat and processed sugars, such as fried or sweet foods. Manage stress with relaxation techniques such as yoga or meditation. General instructions If you have problems with leakage: Use absorbable pads or wear padded underwear. Wash frequently with mild soap. Keep your genital and anal area as clean and dry as possible. Ask your health care provider if you should try a barrier cream to prevent skin irritation. Take warm baths to relieve pelvic muscle tension or spasms. Take over-the-counter and prescription medicines only as told by  your health care provider. Keep all follow-up visits. How is this prevented? The cause of PFD is not always known, but there are a few things you can do to reduce the risk of developing this condition, including: Staying at a healthy weight. Getting regular exercise. Managing stress. Contact a health care provider if: Your symptoms are not improving with home care. You have signs or symptoms of PFD that get worse at home. You develop new signs or symptoms. You have signs of a urinary tract infection, such as: Fever. Chills. Increased urinary frequency. A burning feeling when urinating. You have not had a bowel movement in 3 days (constipation). Summary Pelvic floor dysfunction results when the muscles and connective tissues in your pelvic floor do not work well. These muscles and their connections form a sling that supports your colon and bladder. In women, they also support the uterus. PFD may be caused by an injury to the pelvic area or by a weakening of pelvic muscles. PFD causes pelvic floor muscles to be too weak, too tight, or a combination of both. Symptoms may vary from person to person. In most cases, PFD can be treated with physical therapies and medicines. Surgery may be an option if other treatments do not help. This information is not intended to replace advice given to you by your health care provider. Make sure you discuss any questions you have with your health care provider. Document Revised: 02/23/2021 Document Reviewed: 02/23/2021 Elsevier Patient Education  2022 [employer].   Due to recent changes in healthcare laws, you may see the results of your imaging and laboratory studies on MyChart before your provider has had a chance to review them.  We understand that in some cases there may be results that are confusing or concerning to you. Not all laboratory results come back in the same time frame and the provider may be waiting for multiple results in order to  interpret others.  Please give us  48 hours in order for your provider to thoroughly review all the results before contacting the office for clarification of your results.

## 2024-06-19 LAB — TISSUE TRANSGLUTAMINASE, IGA: (tTG) Ab, IgA: <1 U/mL

## 2024-06-19 LAB — IGA: Immunoglobulin A: 325 mg/dL — ABNORMAL HIGH (ref 47–310)

## 2024-06-20 ENCOUNTER — Ambulatory Visit: Payer: Self-pay | Admitting: Physician Assistant

## 2024-06-20 DIAGNOSIS — D509 Iron deficiency anemia, unspecified: Secondary | ICD-10-CM

## 2024-07-09 ENCOUNTER — Ambulatory Visit: Admitting: Urology

## 2024-07-22 ENCOUNTER — Ambulatory Visit
Admission: EM | Admit: 2024-07-22 | Discharge: 2024-07-22 | Disposition: A | Discharge status: Home / Self Care | Attending: Family Medicine | Admitting: Family Medicine

## 2024-07-22 ENCOUNTER — Ambulatory Visit: Admitting: Physician Assistant

## 2024-07-22 DIAGNOSIS — J029 Acute pharyngitis, unspecified: Secondary | ICD-10-CM

## 2024-07-22 DIAGNOSIS — J020 Streptococcal pharyngitis: Secondary | ICD-10-CM | POA: Diagnosis not present

## 2024-07-22 LAB — POCT RAPID STREP A (OFFICE): Rapid Strep A Screen: POSITIVE — AB

## 2024-07-22 MED ORDER — IBUPROFEN 600 MG PO TABS: 600.0000 mg | ORAL_TABLET | Freq: Three times a day (TID) | ORAL | 0 refills | Status: AC | PRN

## 2024-07-22 MED ORDER — AMOXICILLIN 875 MG PO TABS: 875.0000 mg | ORAL_TABLET | Freq: Two times a day (BID) | ORAL | 0 refills | Status: AC

## 2024-07-22 NOTE — Discharge Instructions (Addendum)
 Strep test is positive 2 days into taking the antibiotics, throw away the toothbrush and begin using a new one. Patient is not contagious after 24 hours of antibiotics; a full 10 days should be completed to prevent rheumatic fever  Take amoxicillin 875 mg--1 tab twice daily for 10 days  Take ibuprofen 600 mg--1 tab every 8 hours as needed for pain.

## 2024-07-22 NOTE — Progress Notes (Deleted)
 07/22/2024 Stephanie Floyd [id number] [birth year]  Referring provider: [name], NP Primary GI doctor: Dr. Charlanne  ASSESSMENT AND PLAN:  Chronic constipation lifelong BM once a week, associated bloating, early satiety, nausea, rare sharp AB pain fleeting, rare BRB on TP, on stool, small amount/large Miralax stopped working after years of use, has used dulcolax, MOM, magnesium citrate 11/19/2023 KUB without significant fecal retention no obstruction 01/09/2024 and 03/09/2024 CT renal stone study no acute abnormality 06/20/2024 negative celiac, CRP negative sed rate slightly elevated CBC without anemia thrombocytosis normal thyroid  kidney liver, ferritin 12.5 iron 78 saturation 18.1. Provided with Linzess samples as patient had failed MiraLAX and Dulcolax -Continue FODMAP diet, kiwi, magnesium - Consider SITS marker test for colonic transit time. - referred to pelvic floor PT  History of frequent UTI/bacterial vaginosis following with Dr. [name] Floyd, last seen 04/09/2024 Sounds like could be pelvic floor dysfunction related Will refer to pelvic floor pt  iron deficiency anemia 06/18/2024  HGB 14.7 MCV 83.7 Platelets 401.0 06/18/2024 Iron 78 Ferritin 12.5  Recent Labs    12/01/23 0947 01/09/24 1021 03/09/24 1311 06/18/24 1155  HGB 13.1 13.5 13.3 14.7  has menses, can be heavy to light but more often heavy   Patient Care Team: Stephanie Waddell NOVAK, NP as PCP - General (Family Medicine) Stephanie Debby CROME, MD (Internal Medicine)  HISTORY OF PRESENT ILLNESS: 28 y.o. female with a past medical history listed below presents as a new patient for evaluation of chronic idiopathic constipation.   Patient last seen in the office 06/18/2024 by myself for chronic constipation.  Thought to be slow colonic transit and dietary factors.  Discussed the use of AI scribe software for clinical note transcription with the patient, who gave verbal consent to proceed.  History of  Present Illness          She  reports that she has been smoking cigarettes and e-cigarettes. She has never used smokeless tobacco. She reports current alcohol use of about 6.0 standard drinks of alcohol per week. She reports that she does not use drugs.  RELEVANT GI HISTORY, IMAGING AND LABS: Results          CBC    Component Value Date/Time   WBC 4.8 06/18/2024 1155   RBC 5.24 (H) 06/18/2024 1155   HGB 14.7 06/18/2024 1155   HGB 13.1 12/01/2023 0947   HCT 43.8 06/18/2024 1155   HCT 42.8 12/01/2023 0947   PLT 401.0 (H) 06/18/2024 1155   PLT 387 12/01/2023 0947   MCV 83.7 06/18/2024 1155   MCV 88 12/01/2023 0947   MCH 27.8 03/09/2024 1311   MCHC 33.6 06/18/2024 1155   RDW 13.4 06/18/2024 1155   RDW 13.6 12/01/2023 0947   LYMPHSABS 1.3 06/18/2024 1155   LYMPHSABS 1.4 12/01/2023 0947   MONOABS 0.5 06/18/2024 1155   EOSABS 0.1 06/18/2024 1155   EOSABS 0.1 12/01/2023 0947   BASOSABS 0.1 06/18/2024 1155   BASOSABS 0.1 12/01/2023 0947   Recent Labs    12/01/23 0947 01/09/24 1021 03/09/24 1311 06/18/24 1155  HGB 13.1 13.5 13.3 14.7    CMP     Component Value Date/Time   NA 139 06/18/2024 1155   NA 138 12/01/2023 0947   K 4.2 06/18/2024 1155   CL 102 06/18/2024 1155   CO2 29 06/18/2024 1155   GLUCOSE 92 06/18/2024 1155   BUN 9 06/18/2024 1155   BUN 8 12/01/2023 0947   CREATININE 0.61 06/18/2024 1155   CALCIUM  9.6 06/18/2024 1155   PROT 7.6 06/18/2024 1155   ALBUMIN 4.5 06/18/2024 1155   AST 24 06/18/2024 1155   ALT 23 06/18/2024 1155   ALKPHOS 48 06/18/2024 1155   BILITOT 0.4 06/18/2024 1155   GFRNONAA >60 03/09/2024 1311   GFRAA >60 03/14/2020 0048      Latest Ref Rng & Units 06/18/2024   11:55 AM 10/13/2020   10:32 AM 03/14/2020   12:48 AM  Hepatic Function  Total Protein 6.0 - 8.3 g/dL 7.6  7.7  7.3   Albumin 3.5 - 5.2 g/dL 4.5  4.5  4.0   AST 0 - 37 U/L 24  17  25    ALT 0 - 35 U/L 23  12  21    Alk Phosphatase 39 - 117 U/L 48  60  65   Total  Bilirubin 0.2 - 1.2 mg/dL 0.4  0.4  0.3   Bilirubin, Direct 0.0 - 0.3 mg/dL  0.1        Current Medications:  No current outpatient medications on file.  Medical History:  Past Medical History:  Diagnosis Date   Anxiety    Asthma    Reflux gastritis    Urine incontinence    UTI (urinary tract infection)    Allergies: No Known Allergies   Surgical History:  She  has a past surgical history that includes Cosmetic surgery. Family History:  Her family history includes Asthma in her brother; Cancer in her maternal grandmother and paternal grandfather; Colon polyps in her mother; Depression in her mother; Hyperlipidemia in her father; Mental illness in her mother.  REVIEW OF SYSTEMS  : All other systems reviewed and negative except where noted in the History of Present Illness.  PHYSICAL EXAM: There were no vitals taken for this visit. Physical Exam          Stephanie JONELLE Coombs, PA-C 7:55 AM

## 2024-07-22 NOTE — ED Triage Notes (Signed)
 Patient reports this started after exposure to cousin's sickness about a week ago (drinking from her cup). Occasional ha and sore throat since. I have looked into my throat and tonsils look inflamed. Doing salt water rinses too that help some. No fever known. No rash. No runny nose. No cough. Occasional ear pain.

## 2024-07-22 NOTE — ED Provider Notes (Signed)
 [facility]    CSN: [id number] Arrival date & time: 07/22/24  0900      History   Chief Complaint Chief Complaint  Patient presents with   Sore Throat   Headache    HPI Stephanie Floyd is a 28 y.o. female.    Sore Throat Associated symptoms include headaches.  Headache Here with sore throat and headache and fever.  Temperature went to 101 yesterday.  The symptoms started 2 days ago.  She had about a weeks worth of nasal congestion prior to the, but no cough.  No acute nausea or diarrhea.  She noted some inflammation and swelling of her tonsils.  She noted white material in her mouth she had thrush  NKDA  Last menstrual cycle September 13.   Past Medical History:  Diagnosis Date   Anxiety    Asthma    Reflux gastritis    Urine incontinence    UTI (urinary tract infection)     Patient Active Problem List   Diagnosis Date Noted   STD exposure 04/24/2024   Vaginal itching 04/24/2024   Current moderate episode of major depressive disorder without prior episode (HCC) 10/16/2020   Chronic idiopathic constipation 10/13/2020   Abnormal weight gain 10/13/2020   Encounter for general adult medical examination with abnormal findings 10/13/2020   Cervical cancer screening 10/13/2020   Thrombocytosis 10/13/2020   Need for hepatitis C screening test 10/13/2020   Need for prophylactic vaccination and inoculation against influenza 10/13/2020    Past Surgical History:  Procedure Laterality Date   COSMETIC SURGERY     liposuction    OB History     Gravida  1   Para  0   Term  0   Preterm  0   AB  0   Living         SAB  0   IAB  0   Ectopic  0   Multiple      Live Births               Home Medications    Prior to Admission medications   Medication Sig Start Date End Date Taking? Authorizing Provider  acetaminophen (TYLENOL) 325 MG tablet Take 650 mg by mouth every 6 (six) hours as needed.   Yes [provider]  amoxicillin  (AMOXIL ) 875 MG tablet Take 1 tablet (875 mg total) by mouth 2 (two) times daily for 10 days. 07/22/24 08/01/24 Yes Vonna Sharlet POUR, MD  fluconazole  (DIFLUCAN ) 150 MG tablet Take 150 mg by mouth daily.   Yes [provider]  ibuprofen  (ADVIL ) 600 MG tablet Take 1 tablet (600 mg total) by mouth every 8 (eight) hours as needed (pain). 07/22/24  Yes Leafy Motsinger, Sharlet POUR, MD    Family History Family History  Problem Relation Age of Onset   Depression Mother    Mental illness Mother    Colon polyps Mother    Hyperlipidemia Father    Asthma Brother    Cancer Maternal Grandmother        ?reproductive organs, unsure   Cancer Paternal Grandfather        testicular   Colon cancer Neg Hx     Social History Social History   Tobacco Use   Smoking status: Some Days    Types: Cigarettes, E-cigarettes   Smokeless tobacco: Never  Vaping Use   Vaping status: Some Days   Substances: Nicotine, Flavoring  Substance Use Topics   Alcohol use: Yes  Alcohol/week: 6.0 standard drinks of alcohol    Types: 3 Cans of beer, 3 Shots of liquor per week    Comment: occasionally   Drug use: No     Allergies   Patient has no known allergies.   Review of Systems Review of Systems  Neurological:  Positive for headaches.     Physical Exam Triage Vital Signs ED Triage Vitals  Encounter Vitals Group     BP 07/22/24 0927 103/70     Girls Systolic BP Percentile --      Girls Diastolic BP Percentile --      Boys Systolic BP Percentile --      Boys Diastolic BP Percentile --      Pulse Rate 07/22/24 0927 (!) 101     Resp 07/22/24 0927 16     Temp 07/22/24 0927 99 F (37.2 C)     Temp Source 07/22/24 0927 Oral     SpO2 07/22/24 0927 98 %     Weight 07/22/24 0925 150 lb (68 kg)     Height 07/22/24 0925 5' 2 (1.575 m)     Head Circumference --      Peak Flow --      Pain Score 07/22/24 0922 6     Pain Loc --      Pain Education --      Exclude from Growth Chart --     No data found.  Updated Vital Signs BP 103/70 (BP Location: Left Arm)   Pulse (!) 101   Temp 99 F (37.2 C) (Oral)   Resp 16   Ht 5' 2 (1.575 m)   Wt 68 kg   LMP 07/12/2024 (Exact Date)   SpO2 98%   BMI 27.44 kg/m   Visual Acuity Right Eye Distance:   Left Eye Distance:   Bilateral Distance:    Right Eye Near:   Left Eye Near:    Bilateral Near:     Physical Exam Vitals reviewed.  Constitutional:      General: She is not in acute distress.    Appearance: She is not toxic-appearing.  HENT:     Ears:     Comments: Tympanic membrane's are bilaterally obscured by cerumen    Nose: Nose normal.     Mouth/Throat:     Mouth: Mucous membranes are moist.     Comments: Both tonsils are hypertrophied with white exudate in the tonsils and there is erythema.  Uvula is midline and there is no swelling of the soft palate  There are no signs of thrush on exam today. Eyes:     Extraocular Movements: Extraocular movements intact.     Conjunctiva/sclera: Conjunctivae normal.     Pupils: Pupils are equal, round, and reactive to light.  Cardiovascular:     Rate and Rhythm: Normal rate and regular rhythm.     Heart sounds: No murmur heard. Pulmonary:     Effort: No respiratory distress.     Breath sounds: No stridor. No wheezing, rhonchi or rales.  Musculoskeletal:     Cervical back: Neck supple.  Lymphadenopathy:     Cervical: No cervical adenopathy.  Skin:    Capillary Refill: Capillary refill takes less than 2 seconds.     Coloration: Skin is not jaundiced or pale.  Neurological:     General: No focal deficit present.     Mental Status: She is alert and oriented to person, place, and time.  Psychiatric:        Behavior:  Behavior normal.      UC Treatments / Results  Labs (all labs ordered are listed, but only abnormal results are displayed) Labs Reviewed  POCT RAPID STREP A (OFFICE) - Abnormal; Notable for the following components:      Result Value   Rapid  Strep A Screen Positive (*)    All other components within normal limits    EKG   Radiology No results found.  Procedures Procedures (including critical care time)  Medications Ordered in UC Medications - No data to display  Initial Impression / Assessment and Plan / UC Course  I have reviewed the triage vital signs and the nursing notes.  Pertinent labs & imaging results that were available during my care of the patient were reviewed by me and considered in my medical decision making (see chart for details).     Strep test is positive.  Amoxicillin  is sent in to treat along with some ibuprofen  prescription strength.   Final Clinical Impressions(s) / UC Diagnoses   Final diagnoses:  Acute pharyngitis, unspecified etiology  Strep pharyngitis     Discharge Instructions      Strep test is positive 2 days into taking the antibiotics, throw away the toothbrush and begin using a new one. Patient is not contagious after 24 hours of antibiotics; a full 10 days should be completed to prevent rheumatic fever  Take amoxicillin  875 mg--1 tab twice daily for 10 days  Take ibuprofen  600 mg--1 tab every 8 hours as needed for pain.      ED Prescriptions     Medication Sig Dispense Auth. Provider   amoxicillin  (AMOXIL ) 875 MG tablet Take 1 tablet (875 mg total) by mouth 2 (two) times daily for 10 days. 20 tablet Calee Nugent, Sharlet POUR, MD   ibuprofen  (ADVIL ) 600 MG tablet Take 1 tablet (600 mg total) by mouth every 8 (eight) hours as needed (pain). 15 tablet Reade Trefz K, MD      PDMP not reviewed this encounter.   Vonna Sharlet POUR, MD 07/22/24 [phone]

## 2024-07-28 ENCOUNTER — Ambulatory Visit: Admitting: Urology

## 2024-09-01 ENCOUNTER — Ambulatory Visit: Admitting: Family Medicine

## 2024-09-01 ENCOUNTER — Ambulatory Visit: Payer: Self-pay

## 2024-09-01 ENCOUNTER — Encounter: Payer: Self-pay | Admitting: Family Medicine

## 2024-09-01 VITALS — BP 100/66 | HR 87 | Temp 98.1°F | Wt 155.7 lb

## 2024-09-01 DIAGNOSIS — R051 Acute cough: Secondary | ICD-10-CM

## 2024-09-01 DIAGNOSIS — J019 Acute sinusitis, unspecified: Secondary | ICD-10-CM

## 2024-09-01 MED ORDER — AZITHROMYCIN 250 MG PO TABS: ORAL_TABLET | ORAL | 0 refills | Status: AC

## 2024-09-01 MED ORDER — BENZONATATE 100 MG PO CAPS: 100.0000 mg | ORAL_CAPSULE | Freq: Three times a day (TID) | ORAL | 0 refills | Status: AC | PRN

## 2024-09-01 NOTE — Telephone Encounter (Signed)
 Pt called and she is currently at office to be seen today.

## 2024-09-01 NOTE — Therapy (Deleted)
 OUTPATIENT PHYSICAL THERAPY FEMALE PELVIC EVALUATION   Patient Name: Stephanie Floyd MRN: [id number] DOB:[birth year], 28 y.o., female Today's Date: 09/01/2024  END OF SESSION:   Past Medical History:  Diagnosis Date   Anxiety    Asthma    Reflux gastritis    Urine incontinence    UTI (urinary tract infection)    Past Surgical History:  Procedure Laterality Date   COSMETIC SURGERY     liposuction   Patient Active Problem List   Diagnosis Date Noted   STD exposure 04/24/2024   Vaginal itching 04/24/2024   Current moderate episode of major depressive disorder without prior episode (HCC) 10/16/2020   Chronic idiopathic constipation 10/13/2020   Abnormal weight gain 10/13/2020   Encounter for general adult medical examination with abnormal findings 10/13/2020   Cervical cancer screening 10/13/2020   Thrombocytosis 10/13/2020   Need for hepatitis C screening test 10/13/2020   Need for prophylactic vaccination and inoculation against influenza 10/13/2020    PCP: [name], NP  REFERRING PROVIDER: Craig Alan SAUNDERS, PA-C   REFERRING DIAG:  X40.95 (ICD-10-CM) - Chronic idiopathic constipation  N30.10 (ICD-10-CM) - Interstitial cystitis    THERAPY DIAG:  No diagnosis found.  Rationale for Evaluation and Treatment: Rehabilitation  ONSET DATE: ***  SUBJECTIVE:                                                                                                                                                                                           SUBJECTIVE STATEMENT: worsening chronic constipation and bloating.  Fluid intake:   FUNCTIONAL LIMITATIONS: ***  PERTINENT HISTORY:  Medications for current condition: *** Surgeries: see above Other: see above Sexual abuse: {Yes/No:304960894}  DIAGNOSTIC FINDINGS:  Post-void residual: Voiding Cystourethrogram (VCUG):  Ultrasound: PAIN:  Are you having pain? {yes/no:20286} NPRS scale: ***/10 Pain location:  {pelvic pain location:27098}  Pain type: {type:313116} Pain description: {PAIN DESCRIPTION:21022940}   Aggravating factors: *** Relieving factors: ***  PRECAUTIONS: {Therapy precautions:24002}  RED FLAGS: {PT Red Flags:29287}   WEIGHT BEARING RESTRICTIONS: {Yes ***/No:24003}  FALLS:  Has patient fallen in last 6 months? {fallsyesno:27318}  OCCUPATION: ***  ACTIVITY LEVEL : ***  PLOF: {PLOF:24004}  PATIENT GOALS: ***   BOWEL MOVEMENT: Pain with bowel movement: {yes/no:20286} Type of bowel movement:{PT BM type:27100} Fully empty rectum: {No/Yes:304960894} Leakage: {Yes/No:304960894}                                                     Caused by: ***  Pads: {Yes/No:304960894} Fiber supplement/laxative {YES/NO AS:20300}  URINATION: Pain with urination: {yes/no:20286} Fully empty bladder: {Yes/No:304960894}***                                Post-void dribble: {YES/NO AS:20300} Stream: {PT urination:27102} Urgency: {YES/NO AS:20300} Frequency:during the day ***                                                         Nocturia: {Yes/No:304960894}***   Leakage: {PT leakage:27103} Pads/briefs: {Yes/No:304960894}  INTERCOURSE:  Ability to have vaginal penetration {YES/NO:21197} Pain with intercourse: {pain with intercourse PA:27099} Dryness: {YES/NO AS:20300} Climax: *** Marinoff Scale: ***/3 Lubricant:  PREGNANCY: Vaginal deliveries *** Tearing {Yes***/No:304960894} Episiotomy {YES/NO AS:20300} C-section deliveries *** Currently pregnant {Yes***/No:304960894}  PROLAPSE: {PT prolapse:27101}   OBJECTIVE:  Note: Objective measures were completed at Evaluation unless otherwise noted.  DIAGNOSTIC FINDINGS:  ***  PATIENT SURVEYS:  {rehab surveys:24030}  PFIQ-7: ***  COGNITION: Overall cognitive status: {cognition:24006}     SENSATION: Light touch: {intact/deficits:24005}  LUMBAR SPECIAL TESTS:  {lumbar special test:25242}  FUNCTIONAL TESTS:   {Functional tests:24029} Single leg stance:  Rt:  Lt: Sit-up test: Squat: Bed mobility:  GAIT: Assistive device utilized: {Assistive devices:23999} Comments: ***  POSTURE: {posture:25561}   LUMBARAROM/PROM:  A/PROM A/PROM  Eval (% available)  Flexion   Extension   Right lateral flexion   Left lateral flexion   Right rotation   Left rotation    (Blank rows = not tested)  LOWER EXTREMITY ROM:  {AROM/PROM:27142} ROM Right eval Left eval  Hip flexion    Hip extension    Hip abduction    Hip adduction    Hip internal rotation    Hip external rotation    Knee flexion    Knee extension    Ankle dorsiflexion    Ankle plantarflexion    Ankle inversion    Ankle eversion     (Blank rows = not tested)  LOWER EXTREMITY MMT:  MMT Right eval Left eval  Hip flexion    Hip extension    Hip abduction    Hip adduction    Hip internal rotation    Hip external rotation    Knee flexion    Knee extension    Ankle dorsiflexion    Ankle plantarflexion    Ankle inversion    Ankle eversion     (Blank rows = not tested) PALPATION:  General: ***  Pelvic Alignment: ***  Abdominal: ***  Diastasis: {Yes/No:304960894}*** Distortion: {YES/NO AS:20300}  Breathing: *** Scar tissue: {Yes/No:304960894}***                External Perineal Exam: ***                             Internal Pelvic Floor: ***  Patient confirms identification and approves PT to assess internal pelvic floor and treatment {yes/no:20286}  PELVIC MMT:   MMT eval  Vaginal   Internal Anal Sphincter   External Anal Sphincter   Puborectalis   Diastasis Recti   (Blank rows = not tested)        TONE: ***  PROLAPSE: ***  TODAY'S TREATMENT:  DATE: ***  EVAL ***   PATIENT EDUCATION:  Education details: *** Person educated: {Person educated:25204} Education  method: {Education Method:25205} Education comprehension: {Education Comprehension:25206}  HOME EXERCISE PROGRAM: ***  ASSESSMENT:  CLINICAL IMPRESSION: Patient is a *** y.o. *** who was seen today for physical therapy evaluation and treatment for ***.   OBJECTIVE IMPAIRMENTS: {opptimpairments:25111}.   ACTIVITY LIMITATIONS: {activitylimitations:27494}  PARTICIPATION LIMITATIONS: {participationrestrictions:25113}  PERSONAL FACTORS: {Personal factors:25162} are also affecting patient's functional outcome.   REHAB POTENTIAL: {rehabpotential:25112}  CLINICAL DECISION MAKING: {clinical decision making:25114}  EVALUATION COMPLEXITY: {Evaluation complexity:25115}   GOALS: Goals reviewed with patient? {yes/no:20286}  SHORT TERM GOALS: Target date: ***  *** Baseline: Goal status: INITIAL  2.  *** Baseline:  Goal status: INITIAL  3.  *** Baseline:  Goal status: INITIAL  4.  *** Baseline:  Goal status: INITIAL  5.  *** Baseline:  Goal status: INITIAL  6.  *** Baseline:  Goal status: INITIAL  LONG TERM GOALS: Target date: ***  *** Baseline:  Goal status: INITIAL  2.  *** Baseline:  Goal status: INITIAL  3.  *** Baseline:  Goal status: INITIAL  4.  *** Baseline:  Goal status: INITIAL  5.  *** Baseline:  Goal status: INITIAL  6.  *** Baseline:  Goal status: INITIAL  PLAN:  PT FREQUENCY: {rehab frequency:25116}  PT DURATION: {rehab duration:25117}  PLANNED INTERVENTIONS: {rehab planned interventions:25118::97110-Therapeutic exercises,97530- Therapeutic [phone]- Neuromuscular re-education,97535- Self Rjmz,02859- Manual therapy,Patient/Family education}  PLAN FOR NEXT SESSION: ***   Antwane Grose, PT 09/01/2024, 8:35 AM

## 2024-09-01 NOTE — Progress Notes (Signed)
 Established Patient Office Visit  Subjective   Patient ID: Stephanie Floyd, female    DOB: [birth year]  Age: 28 y.o. MRN: [id number]  Chief Complaint  Patient presents with   Cough        Nasal Congestion   Ear Fullness    HPI   Stephanie Floyd is seen as a work in today with some upper respiratory symptoms for about a week and a half.  She has had some nasal congestion, bilateral ear fullness, cough which initially was nonproductive and now productive of some greenish sputum.  She tried some Mucinex  DM but had significant sedation.  Cough has been fairly severe at times.  No documented fever.  Denies any chronic lung disease.  She has had some progressive headaches and facial pressure and pain at times.  No known sick contacts.  Past Medical History:  Diagnosis Date   Anxiety    Asthma    Reflux gastritis    Urine incontinence    UTI (urinary tract infection)    Past Surgical History:  Procedure Laterality Date   COSMETIC SURGERY     liposuction    reports that she has been smoking cigarettes and e-cigarettes. She has never used smokeless tobacco. She reports current alcohol use of about 6.0 standard drinks of alcohol per week. She reports that she does not use drugs. family history includes Asthma in her brother; Cancer in her maternal grandmother and paternal grandfather; Colon polyps in her mother; Depression in her mother; Hyperlipidemia in her father; Mental illness in her mother. No Known Allergies  Review of Systems  Constitutional:  Negative for chills and fever.  HENT:  Positive for congestion and sinus pain. Negative for ear discharge and ear pain.   Respiratory:  Positive for cough.   Cardiovascular:  Negative for chest pain.      Objective:     BP 100/66   Pulse 87   Temp 98.1 F (36.7 C) (Oral)   Wt 155 lb 11.2 oz (70.6 kg)   SpO2 95%   BMI 28.48 kg/m  BP Readings from Last 3 Encounters:  09/01/24 100/66  07/22/24 103/70  06/18/24 102/74   Wt  Readings from Last 3 Encounters:  09/01/24 155 lb 11.2 oz (70.6 kg)  07/22/24 150 lb (68 kg)  06/18/24 156 lb (70.8 kg)      Physical Exam Vitals reviewed.  Constitutional:      General: She is not in acute distress.    Appearance: She is not ill-appearing.  HENT:     Right Ear: Tympanic membrane normal.     Ears:     Comments: Has some cerumen partially obscuring the left eardrum Cardiovascular:     Rate and Rhythm: Normal rate and regular rhythm.  Pulmonary:     Effort: Pulmonary effort is normal. No respiratory distress.     Breath sounds: Normal breath sounds. No wheezing or rales.  Neurological:     Mental Status: She is alert.      No results found for any visits on 09/01/24.    The ASCVD Risk score (Arnett DK, et al., 2019) failed to calculate for the following reasons:   The 2019 ASCVD risk score is only valid for ages 40 to 34    Assessment & Plan:   Acute sinusitis symptoms.  Started with typical cold-like symptoms.  Symptoms of facial pain and pressure have progressed.  We wrote for Tessalon  Perles 100 mg every 8 hours as needed for cough.  Zithromax for 5 days.  Follow-up for any persistent or worsening symptoms  Wolm Scarlet, MD

## 2024-09-01 NOTE — Telephone Encounter (Signed)
 First attempt to contact patient for triage, LVM for patient to return call to [phone]. Placed in callbacks for follow up  Copied from CRM #[id number]. Topic: Clinical - Red Word Triage >> Sep 01, 2024 12:49 PM Burnard DEL wrote: Red Word that prompted transfer to Nurse Triage: chest tightness, phlemg in lungs

## 2024-09-02 ENCOUNTER — Encounter: Payer: Self-pay | Admitting: Family Medicine

## 2024-09-02 ENCOUNTER — Ambulatory Visit: Admitting: Family Medicine

## 2024-09-02 ENCOUNTER — Ambulatory Visit: Payer: Self-pay

## 2024-09-02 ENCOUNTER — Encounter: Admitting: Physical Therapy

## 2024-09-02 VITALS — BP 102/66 | HR 83 | Temp 98.0°F | Resp 16 | Ht 61.0 in | Wt 155.2 lb

## 2024-09-02 DIAGNOSIS — N393 Stress incontinence (female) (male): Secondary | ICD-10-CM | POA: Diagnosis not present

## 2024-09-02 LAB — POC URINALSYSI DIPSTICK (AUTOMATED)
Bilirubin, UA: NEGATIVE
Blood, UA: NEGATIVE
Glucose, UA: NEGATIVE
Nitrite, UA: NEGATIVE
Protein, UA: NEGATIVE
Spec Grav, UA: 1.01 (ref 1.010–1.025)
Urobilinogen, UA: 0.2 U/dL
pH, UA: 6 (ref 5.0–8.0)

## 2024-09-02 MED ORDER — FLUCONAZOLE 150 MG PO TABS: 150.0000 mg | ORAL_TABLET | Freq: Every day | ORAL | 0 refills | Status: AC

## 2024-09-02 NOTE — Progress Notes (Signed)
 Acute Office Visit  Subjective:  Patient ID: Stephanie Floyd, female    DOB: [birth year]  Age: 28 y.o. MRN: [id number]  CC:  Chief Complaint  Patient presents with   Urinary Tract Infection    Saw Dr Micheal for URI sx's- states coughing so hard its making her have urinary stress incontinence, hx of recurrent UTI       HPI Stephanie Floyd is here for Pain with Urination and Urinary Urgency.   Discussed the use of AI scribe software for clinical note transcription with the patient, who gave verbal consent to proceed.  History of Present Illness Stephanie Floyd is a 28 year old female who presents with symptoms of a possible urinary tract infection and sinus infection.  She experiences symptoms suggestive of a urinary tract infection, including urgency and small leaks of urine. No burning sensation during urination or changes in urine color. She has a history of urinary incontinence and has previously attended pelvic floor therapy but is apprehensive about returning.  She is currently being treated for a sinus infection with a Z-Pak. She has a persistent cough lasting a week and a half, without fever or nasal discharge. She describes experiencing chills and body aches, for which she alternates between Tylenol and ibuprofen . There is some improvement in her cough after starting the Z-Pak, as she did not wake up due to coughing the previous night.  She is concerned about developing a yeast infection due to antibiotic use.          Past Medical History:  Diagnosis Date   Anxiety    Asthma    Reflux gastritis    Urine incontinence    UTI (urinary tract infection)     Past Surgical History:  Procedure Laterality Date   COSMETIC SURGERY     liposuction    Family History  Problem Relation Age of Onset   Depression Mother    Mental illness Mother    Colon polyps Mother    Hyperlipidemia Father    Asthma Brother    Cancer Maternal Grandmother         ?reproductive organs, unsure   Cancer Paternal Grandfather        testicular   Colon cancer Neg Hx     Social History   Socioeconomic History   Marital status: Single    Spouse name: Not Stephanie file   Number of children: Not Stephanie file   Years of education: Not Stephanie file   Highest education level: 12th grade  Occupational History   Not Stephanie file  Tobacco Use   Smoking status: Some Days    Types: Cigarettes, E-cigarettes   Smokeless tobacco: Never  Vaping Use   Vaping status: Some Days   Substances: Nicotine, Flavoring  Substance and Sexual Activity   Alcohol use: Yes    Alcohol/week: 6.0 standard drinks of alcohol    Types: 3 Cans of beer, 3 Shots of liquor per week    Comment: occasionally   Drug use: No   Sexual activity: Yes    Partners: Male    Birth control/protection: Condom, None  Other Topics Concern   Not Stephanie file  Social History Narrative   ** Merged History Encounter **       Social Drivers of Health   Financial Resource Strain: Medium Risk (02/28/2024)   Overall Financial Resource Strain (CARDIA)    Difficulty of Paying Living Expenses: Somewhat hard  Food Insecurity: Food Insecurity Present (02/28/2024)  Hunger Vital Sign    Worried About Running Out of Food in the Last Year: Sometimes true    Ran Out of Food in the Last Year: Sometimes true  Transportation Needs: No Transportation Needs (02/28/2024)   PRAPARE - Administrator, Civil Service (Medical): No    Lack of Transportation (Non-Medical): No  Physical Activity: Sufficiently Active (02/28/2024)   Exercise Vital Sign    Days of Exercise per Week: 5 days    Minutes of Exercise per Session: 30 min  Stress: Stress Concern Present (02/28/2024)   [employer] of Occupational Health - Occupational Stress Questionnaire    Feeling of Stress : Rather much  Social Connections: Unknown (02/28/2024)   Social Connection and Isolation Panel    Frequency of Communication with Friends and Family: More than  three times a week    Frequency of Social Gatherings with Friends and Family: Never    Attends Religious Services: Never    Database Administrator or Organizations: No    Attends Engineer, Structural: Not Stephanie file    Marital Status: Patient declined  Intimate Partner Violence: Not Stephanie file    ROS All ROS negative except what is listed in the HPI.   Objective:   Today's Vitals: BP 102/66   Pulse 83   Temp 98 F (36.7 C) (Oral)   Resp 16   Ht 5' 1 (1.549 m)   Wt 155 lb 4 oz (70.4 kg)   LMP 08/15/2024 (Exact Date)   SpO2 94%   BMI 29.33 kg/m   Physical Exam Vitals reviewed.  Constitutional:      Appearance: Normal appearance.  Abdominal:     Comments: No suprapubic tenderness  Neurological:     Mental Status: She is alert and oriented to person, place, and time.  Psychiatric:        Mood and Affect: Mood normal.        Behavior: Behavior normal.        Thought Content: Thought content normal.        Judgment: Judgment normal.       Assessment & Plan:   Problem List Items Addressed This Visit   None Visit Diagnoses       Stress incontinence    -  Primary   Relevant Orders   POCT Urinalysis Dipstick (Automated) (Completed)   Urine Culture       Assessment & Plan Currently Stephanie ABX for sinusitis/bronchitis. Requesting Diflucan  in case she develops a yeast infection.   Urinary urgency and stress incontinence Experiencing urgency and small leaks, likely stress incontinence from coughing. No dysuria or hematuria.  Leukocytes present in urine, no other abnormalities. Urine culture ordered to confirm UTI and guide antibiotic choice.  Possible urinary tract infection         Follow-up: Return if symptoms worsen or fail to improve.   Waddell FURY Almarie, DNP, FNP-C  I,Emily Lagle,acting as a neurosurgeon for Waddell KATHEE Almarie, NP.,have documented all relevant documentation Stephanie the behalf of Waddell KATHEE Almarie, NP.   I, Waddell KATHEE Almarie, NP, have reviewed all  documentation for this visit. The documentation Stephanie 09/02/2024 for the exam, diagnosis, procedures, and orders are all accurate and complete.

## 2024-09-02 NOTE — Telephone Encounter (Signed)
 FYI Only or Action Required?: FYI only for provider: appointment scheduled on today.  Patient was last seen in primary care on 09/01/2024 by Micheal Wolm ORN, MD.  Called Nurse Triage reporting Dysuria.  Symptoms began 2 to 3 days.  Interventions attempted: Nothing.  Symptoms are: gradually worsening.  Triage Disposition: See Physician Within 24 Hours  Patient/caregiver understands and will follow disposition?: Yes    Copied from CRM #[id number]. Topic: Clinical - Red Word Triage >> Sep 02, 2024  9:50 AM Stephanie Floyd wrote: [facility] that prompted transfer to Nurse Triage: Patient states she feels like she is very dehydrated , extreme cough and bronchitis, and feels like she has a UTI with pain and burning when urinating. Reason for Disposition  Unusual vaginal discharge (e.g., bad smelling, yellow, green, or foamy-white)  Answer Assessment - Initial Assessment Questions 1. SEVERITY: How bad is the pain?  (e.g., Scale 1-10; mild, moderate, or severe)     5/10 2. FREQUENCY: How many times have you had painful urination today?      Each time 3. PATTERN: Is pain present every time you urinate or just sometimes?      constant 4. ONSET: When did the painful urination start?      2 to 3 days ago and worsening 5. FEVER: Do you have a fever? If Yes, ask: What is your temperature, how was it measured, and when did it start? no 6. PAST UTI: Have you had a urine infection before? If Yes, ask: When was the last time? and What happened that time?      yes 7. CAUSE: What do you think is causing the painful urination?  (e.g., UTI, scratch, Herpes sore)     UT I 8. OTHER SYMPTOMS: Do you have any other symptoms? (e.g., blood in urine, flank pain, genital sores, urgency, vaginal discharge)     Low abd pain, chills on spine, burning with urination, urgency, frequency 9. PREGNANCY: Is there any chance you are pregnant? When was your last menstrual period?      no  Protocols used: Urination Pain - Female-A-AH

## 2024-09-04 ENCOUNTER — Ambulatory Visit: Payer: Self-pay | Admitting: Family Medicine

## 2024-09-04 DIAGNOSIS — N3000 Acute cystitis without hematuria: Secondary | ICD-10-CM

## 2024-09-04 LAB — URINE CULTURE
MICRO NUMBER:: 17188231
SPECIMEN QUALITY:: ADEQUATE

## 2024-09-04 MED ORDER — NITROFURANTOIN MONOHYD MACRO 100 MG PO CAPS: 100.0000 mg | ORAL_CAPSULE | Freq: Two times a day (BID) | ORAL | 0 refills | Status: AC

## 2024-09-09 ENCOUNTER — Encounter: Admitting: Physical Therapy

## 2024-09-16 ENCOUNTER — Encounter: Admitting: Physical Therapy

## 2024-09-23 ENCOUNTER — Encounter: Admitting: Physical Therapy

## 2024-10-14 ENCOUNTER — Encounter: Admitting: Family Medicine

## 2024-10-14 DIAGNOSIS — Z0000 Encounter for general adult medical examination without abnormal findings: Secondary | ICD-10-CM

## 2024-10-14 NOTE — Progress Notes (Incomplete)
 Complete physical exam  Patient: Stephanie Floyd   DOB: 16-Oct-1996   28 y.o. Female  MRN: [id number]  Subjective:    No chief complaint on file.   Stephanie Floyd is a 28 y.o. female who presents today for a complete physical exam. She reports consuming a {diet types:17450} diet. {types:19826} She generally feels {DESC; WELL/FAIRLY WELL/POORLY:18703}. She reports sleeping {DESC; WELL/FAIRLY WELL/POORLY:18703}. She {does/does not:200015} have additional problems to discuss today.   Currently lives with: *** Acute concerns or interim problems since last visit: ***   Vision concerns: *** Dental concerns: *** STD concerns: ***  ETOH use: *** Nicotine use: *** Recreational drugs/illegal substances: ***  Females:  She is {Blank single:19197::currently,not currently,never}  sexually active  Contraception choices are: *** LMP: No LMP recorded.      Most recent fall risk assessment:    02/28/2024    4:40 PM  Fall Risk   Falls in the past year? 0  Number falls in past yr: 0  Injury with Fall? 0   Risk for fall due to : No Fall Risks  Follow up Falls evaluation completed     Data saved with a previous flowsheet row definition     Most recent depression screenings:    02/28/2024    4:41 PM 10/13/2020    9:56 AM  PHQ 2/9 Scores  PHQ - 2 Score 2 0  PHQ- 9 Score 8       Data saved with a previous flowsheet row definition          {History (Optional):23778}  Patient Care Team: [name], NP as PCP - General (Family Medicine) [name], MD (Internal Medicine)   Show/hide medication list[1]  ROS        Objective:     There were no vitals taken for this visit. {Vitals History (Optional):23777}  Physical Exam   No results found for any visits on 10/14/24. {Show previous labs (optional):23779}    Assessment & Plan:    Routine Health Maintenance and Physical Exam Discussed health promotion and safety including diet and  exercise recommendations, dental health, and injury prevention. Tobacco cessation if applicable. Seat belts, sunscreen, smoke detectors, etc.    Immunization History  Administered Date(s) Administered   DTaP 07/15/1997, 09/17/1997   Dtap, Unspecified 01/05/1998, 08/30/1998, 10/28/2001   Fluzone Influenza virus vaccine,trivalent (IIV3), split virus 10/17/2011   HIB, Unspecified 07/15/1997, 09/17/1997, 01/05/1998, 08/30/1998   HPV Quadrivalent 05/25/2009, 05/04/2011, 10/17/2011   Hep B, Unspecified 13-Apr-1997, 06/19/1997, 06/16/1998   Hepatitis A, Ped/Adol-2 Dose 03/02/2008, 05/25/2009   IPV 07/15/1997, 09/17/1997   Influenza, Seasonal, Injecte, Preservative Fre 08/12/2008   Influenza,Quad,Nasal, Live 09/04/2013   Influenza,inj,Quad PF,6+ Mos 11/29/2015, 10/13/2020   MMR 06/16/1998, 10/28/2001   Meningococcal B, OMV 11/29/2015   Meningococcal Conjugate 05/04/2011, 09/04/2013   PFIZER(Purple Top)SARS-COV-2 Vaccination 01/24/2020, 02/17/2020   Polio, Unspecified 01/05/1998, 10/28/2001   Tdap 03/02/2008, 10/13/2020   Varicella 12/15/1998, 03/02/2008    Health Maintenance  Topic Date Due   Pneumococcal Vaccine (1 of 2 - PCV) Never done   Meningococcal B Vaccine (2 of 2 - Bexsero SCDM 2-dose series) 05/28/2016   Cervical Cancer Screening (Pap smear)  Never done   Influenza Vaccine  05/30/2024   COVID-19 Vaccine (3 - 2025-26 season) 10/29/2024 (Originally 06/30/2024)   DTaP/Tdap/Td (8 - Td or Tdap) 10/13/2030   HPV VACCINES  Completed   Hepatitis C Screening  Completed   HIV Screening  Completed  Problem List Items Addressed This Visit   None Visit Diagnoses       Annual physical exam    -  Primary          PATIENT COUNSELING:   Advised to take 1 mg of folate supplement per day if capable of pregnancy.   Encouraged smoking cessation.   Recommend that most people either abstain from alcohol or drink within safe limits (<=14/week and <=4 drinks/occasion for males,  <=7/weeks and <= 3 drinks/occasion for females) and that the risk for alcohol disorders and other health effects rises proportionally with the number of drinks per week and how often a drinker exceeds daily limits.   Diet: Recommend to adjust caloric intake to maintain or achieve ideal body weight, to reduce intake of dietary saturated fat and total fat, to limit sodium intake by avoiding high sodium foods and not adding table salt, and to maintain adequate dietary potassium and calcium preferably from fresh fruits, vegetables, and low-fat dairy products.   Emphasized the importance of regular exercise.  Injury prevention: Recommend seatbelts, safety helmets, smoke detector, etc..   Dental health: Recommend regular tooth brushing, flossing, and dental visits.       No follow-ups on file.     [name], NP  I,Emily Lagle,acting as a scribe for [name], NP.,have documented all relevant documentation on the behalf of [name], NP.  I, [name], NP, have reviewed all documentation for this visit. The documentation on 10/14/2024 for the exam, diagnosis, procedures, and orders are all accurate and complete.    [1]  Outpatient Medications Prior to Visit  Medication Sig   acetaminophen (TYLENOL) 325 MG tablet Take 650 mg by mouth every 6 (six) hours as needed. (Patient not taking: Reported on 09/02/2024)   benzonatate  (TESSALON ) 100 MG capsule Take 1 capsule (100 mg total) by mouth 3 (three) times daily as needed for cough. (Patient not taking: Reported on 09/02/2024)   fluconazole  (DIFLUCAN ) 150 MG tablet Take 1 tablet (150 mg total) by mouth daily. May repeat in 3 days if needed.   ibuprofen  (ADVIL ) 600 MG tablet Take 1 tablet (600 mg total) by mouth every 8 (eight) hours as needed (pain). (Patient not taking: Reported on 09/02/2024)   No facility-administered medications prior to visit.
